# Patient Record
Sex: Female | Born: 1935 | Hispanic: No | Smoking: Former smoker
Health system: Southern US, Community
[De-identification: ages and names within clinical notes are randomized; demographics above are authoritative.]

## PROBLEM LIST (undated history)

## (undated) DIAGNOSIS — J449 Chronic obstructive pulmonary disease, unspecified: Secondary | ICD-10-CM

## (undated) DIAGNOSIS — F341 Dysthymic disorder: Secondary | ICD-10-CM

## (undated) DIAGNOSIS — N189 Chronic kidney disease, unspecified: Secondary | ICD-10-CM

## (undated) DIAGNOSIS — I714 Abdominal aortic aneurysm, without rupture, unspecified: Secondary | ICD-10-CM

## (undated) DIAGNOSIS — R7301 Impaired fasting glucose: Secondary | ICD-10-CM

## (undated) DIAGNOSIS — G479 Sleep disorder, unspecified: Secondary | ICD-10-CM

## (undated) DIAGNOSIS — E785 Hyperlipidemia, unspecified: Secondary | ICD-10-CM

## (undated) DIAGNOSIS — D649 Anemia, unspecified: Secondary | ICD-10-CM

## (undated) DIAGNOSIS — I10 Essential (primary) hypertension: Secondary | ICD-10-CM

## (undated) DIAGNOSIS — H3530 Unspecified macular degeneration: Secondary | ICD-10-CM

## (undated) HISTORY — DX: Essential (primary) hypertension: I10

## (undated) HISTORY — DX: Chronic obstructive pulmonary disease, unspecified: J44.9

## (undated) HISTORY — PX: ABDOMINAL AORTIC ANEURYSM REPAIR W/ ENDOLUMINAL GRAFT: SUR7

## (undated) HISTORY — DX: Impaired fasting glucose: R73.01

## (undated) HISTORY — DX: Unspecified macular degeneration: H35.30

## (undated) HISTORY — DX: Abdominal aortic aneurysm, without rupture, unspecified: I71.40

## (undated) HISTORY — DX: Chronic kidney disease, unspecified: N18.9

## (undated) HISTORY — DX: Dysthymic disorder: F34.1

## (undated) HISTORY — DX: Hyperlipidemia, unspecified: E78.5

## (undated) HISTORY — DX: Anemia, unspecified: D64.9

## (undated) HISTORY — DX: Abdominal aortic aneurysm, without rupture: I71.4

## (undated) HISTORY — DX: Sleep disorder, unspecified: G47.9

---

## 2004-06-10 ENCOUNTER — Emergency Department: Payer: Self-pay | Admitting: Emergency Medicine

## 2004-06-11 ENCOUNTER — Ambulatory Visit: Payer: Self-pay | Admitting: Emergency Medicine

## 2004-06-25 ENCOUNTER — Ambulatory Visit: Payer: Self-pay | Admitting: Surgery

## 2004-08-10 ENCOUNTER — Emergency Department: Payer: Self-pay | Admitting: Emergency Medicine

## 2005-01-22 ENCOUNTER — Ambulatory Visit: Payer: Self-pay | Admitting: Internal Medicine

## 2005-01-23 ENCOUNTER — Ambulatory Visit: Payer: Self-pay | Admitting: Surgery

## 2005-02-06 ENCOUNTER — Ambulatory Visit: Payer: Self-pay | Admitting: Internal Medicine

## 2005-03-14 ENCOUNTER — Ambulatory Visit: Payer: Self-pay | Admitting: Gastroenterology

## 2005-09-03 ENCOUNTER — Ambulatory Visit: Payer: Self-pay | Admitting: Ophthalmology

## 2005-09-09 ENCOUNTER — Ambulatory Visit: Payer: Self-pay | Admitting: Ophthalmology

## 2005-10-07 ENCOUNTER — Ambulatory Visit: Payer: Self-pay | Admitting: Ophthalmology

## 2005-12-06 ENCOUNTER — Ambulatory Visit: Payer: Self-pay | Admitting: Internal Medicine

## 2006-01-23 ENCOUNTER — Ambulatory Visit: Payer: Self-pay | Admitting: Internal Medicine

## 2006-12-09 ENCOUNTER — Ambulatory Visit: Payer: Self-pay | Admitting: Internal Medicine

## 2007-06-15 ENCOUNTER — Inpatient Hospital Stay: Payer: Self-pay | Admitting: Cardiovascular Disease

## 2007-06-16 ENCOUNTER — Other Ambulatory Visit: Payer: Self-pay

## 2007-10-16 ENCOUNTER — Encounter: Payer: Self-pay | Admitting: Internal Medicine

## 2007-11-09 ENCOUNTER — Encounter: Payer: Self-pay | Admitting: Internal Medicine

## 2007-12-08 ENCOUNTER — Ambulatory Visit: Payer: Self-pay | Admitting: Neurology

## 2007-12-09 ENCOUNTER — Encounter: Payer: Self-pay | Admitting: Internal Medicine

## 2008-01-09 ENCOUNTER — Encounter: Payer: Self-pay | Admitting: Internal Medicine

## 2008-07-27 ENCOUNTER — Ambulatory Visit: Payer: Self-pay | Admitting: Vascular Surgery

## 2008-10-23 ENCOUNTER — Inpatient Hospital Stay: Payer: Self-pay | Admitting: Internal Medicine

## 2008-10-31 ENCOUNTER — Observation Stay: Payer: Self-pay | Admitting: Internal Medicine

## 2008-11-11 ENCOUNTER — Emergency Department: Payer: Self-pay | Admitting: Emergency Medicine

## 2008-11-27 ENCOUNTER — Emergency Department: Payer: Self-pay | Admitting: Emergency Medicine

## 2009-01-01 ENCOUNTER — Emergency Department: Payer: Self-pay | Admitting: Emergency Medicine

## 2009-02-10 ENCOUNTER — Ambulatory Visit: Payer: Self-pay | Admitting: Internal Medicine

## 2009-02-10 ENCOUNTER — Emergency Department: Payer: Self-pay | Admitting: Emergency Medicine

## 2009-04-30 ENCOUNTER — Emergency Department: Payer: Self-pay

## 2010-06-12 ENCOUNTER — Inpatient Hospital Stay: Payer: Self-pay | Admitting: Internal Medicine

## 2011-03-21 ENCOUNTER — Emergency Department: Payer: Self-pay | Admitting: Emergency Medicine

## 2011-03-23 ENCOUNTER — Emergency Department: Payer: Self-pay | Admitting: Emergency Medicine

## 2011-03-29 ENCOUNTER — Emergency Department: Payer: Self-pay | Admitting: Emergency Medicine

## 2012-05-02 ENCOUNTER — Inpatient Hospital Stay: Payer: Self-pay | Admitting: Internal Medicine

## 2012-05-02 LAB — CBC WITH DIFFERENTIAL/PLATELET
Basophil #: 0.1 10*3/uL (ref 0.0–0.1)
Basophil %: 1.5 %
Eosinophil %: 1.2 %
HCT: 34.7 % — ABNORMAL LOW (ref 35.0–47.0)
Lymphocyte #: 2 10*3/uL (ref 1.0–3.6)
MCHC: 32.5 g/dL (ref 32.0–36.0)
MCV: 87 fL (ref 80–100)
Monocyte %: 8.6 %
Neutrophil #: 5.6 10*3/uL (ref 1.4–6.5)
RBC: 3.98 10*6/uL (ref 3.80–5.20)
RDW: 13.6 % (ref 11.5–14.5)
WBC: 8.6 10*3/uL (ref 3.6–11.0)

## 2012-05-02 LAB — BASIC METABOLIC PANEL
BUN: 22 mg/dL — ABNORMAL HIGH (ref 7–18)
Chloride: 105 mmol/L (ref 98–107)
EGFR (Non-African Amer.): 56 — ABNORMAL LOW
Glucose: 99 mg/dL (ref 65–99)
Osmolality: 283 (ref 275–301)
Potassium: 3.2 mmol/L — ABNORMAL LOW (ref 3.5–5.1)
Sodium: 140 mmol/L (ref 136–145)

## 2012-05-03 LAB — URINALYSIS, COMPLETE
Glucose,UR: NEGATIVE mg/dL (ref 0–75)
Hyaline Cast: 1
Nitrite: NEGATIVE
Ph: 5 (ref 4.5–8.0)
Protein: NEGATIVE
Specific Gravity: 1.014 (ref 1.003–1.030)
Squamous Epithelial: 2

## 2012-05-03 LAB — CBC WITH DIFFERENTIAL/PLATELET
Basophil #: 0 10*3/uL (ref 0.0–0.1)
Eosinophil %: 0 %
HGB: 11.8 g/dL — ABNORMAL LOW (ref 12.0–16.0)
Lymphocyte #: 0.8 10*3/uL — ABNORMAL LOW (ref 1.0–3.6)
Lymphocyte %: 10.3 %
MCH: 28.5 pg (ref 26.0–34.0)
MCHC: 33.1 g/dL (ref 32.0–36.0)
MCV: 86 fL (ref 80–100)
Monocyte #: 0 x10 3/mm — ABNORMAL LOW (ref 0.2–0.9)
Monocyte %: 0.5 %
Neutrophil #: 6.6 10*3/uL — ABNORMAL HIGH (ref 1.4–6.5)
Neutrophil %: 89.1 %

## 2012-05-03 LAB — BASIC METABOLIC PANEL
Anion Gap: 6 — ABNORMAL LOW (ref 7–16)
BUN: 24 mg/dL — ABNORMAL HIGH (ref 7–18)
Chloride: 106 mmol/L (ref 98–107)
Creatinine: 1.03 mg/dL (ref 0.60–1.30)
EGFR (African American): 57 — ABNORMAL LOW
Glucose: 172 mg/dL — ABNORMAL HIGH (ref 65–99)
Potassium: 3.7 mmol/L (ref 3.5–5.1)
Sodium: 141 mmol/L (ref 136–145)

## 2012-05-04 LAB — CBC WITH DIFFERENTIAL/PLATELET
Basophil #: 0 10*3/uL (ref 0.0–0.1)
Eosinophil #: 0 10*3/uL (ref 0.0–0.7)
HCT: 34 % — ABNORMAL LOW (ref 35.0–47.0)
HGB: 11 g/dL — ABNORMAL LOW (ref 12.0–16.0)
Lymphocyte #: 1 10*3/uL (ref 1.0–3.6)
MCH: 27.9 pg (ref 26.0–34.0)
MCHC: 32.2 g/dL (ref 32.0–36.0)
Neutrophil #: 18.4 10*3/uL — ABNORMAL HIGH (ref 1.4–6.5)
Platelet: 284 10*3/uL (ref 150–440)
RDW: 13.7 % (ref 11.5–14.5)

## 2012-05-04 LAB — BASIC METABOLIC PANEL
Anion Gap: 4 — ABNORMAL LOW (ref 7–16)
BUN: 24 mg/dL — ABNORMAL HIGH (ref 7–18)
Chloride: 108 mmol/L — ABNORMAL HIGH (ref 98–107)
Creatinine: 0.94 mg/dL (ref 0.60–1.30)
Osmolality: 289 (ref 275–301)
Sodium: 141 mmol/L (ref 136–145)

## 2013-08-03 ENCOUNTER — Encounter: Payer: Self-pay | Admitting: Internal Medicine

## 2013-08-03 DIAGNOSIS — I714 Abdominal aortic aneurysm, without rupture, unspecified: Secondary | ICD-10-CM | POA: Insufficient documentation

## 2013-08-03 DIAGNOSIS — I10 Essential (primary) hypertension: Secondary | ICD-10-CM | POA: Insufficient documentation

## 2013-08-03 DIAGNOSIS — N289 Disorder of kidney and ureter, unspecified: Secondary | ICD-10-CM | POA: Insufficient documentation

## 2013-08-03 DIAGNOSIS — N189 Chronic kidney disease, unspecified: Secondary | ICD-10-CM | POA: Insufficient documentation

## 2013-08-03 DIAGNOSIS — D649 Anemia, unspecified: Secondary | ICD-10-CM | POA: Insufficient documentation

## 2013-08-03 DIAGNOSIS — J449 Chronic obstructive pulmonary disease, unspecified: Secondary | ICD-10-CM | POA: Insufficient documentation

## 2013-08-03 DIAGNOSIS — E785 Hyperlipidemia, unspecified: Secondary | ICD-10-CM | POA: Insufficient documentation

## 2013-08-03 DIAGNOSIS — R7301 Impaired fasting glucose: Secondary | ICD-10-CM | POA: Insufficient documentation

## 2013-08-11 ENCOUNTER — Encounter: Payer: Self-pay | Admitting: Internal Medicine

## 2013-08-11 ENCOUNTER — Ambulatory Visit: Payer: Medicare Other | Admitting: Internal Medicine

## 2013-08-11 ENCOUNTER — Telehealth: Payer: Self-pay | Admitting: Internal Medicine

## 2013-08-11 VITALS — BP 124/72 | HR 79 | Resp 26

## 2013-08-11 DIAGNOSIS — E785 Hyperlipidemia, unspecified: Secondary | ICD-10-CM

## 2013-08-11 DIAGNOSIS — F39 Unspecified mood [affective] disorder: Secondary | ICD-10-CM | POA: Insufficient documentation

## 2013-08-11 DIAGNOSIS — J449 Chronic obstructive pulmonary disease, unspecified: Secondary | ICD-10-CM

## 2013-08-11 DIAGNOSIS — I10 Essential (primary) hypertension: Secondary | ICD-10-CM

## 2013-08-11 DIAGNOSIS — G479 Sleep disorder, unspecified: Secondary | ICD-10-CM

## 2013-08-11 DIAGNOSIS — I714 Abdominal aortic aneurysm, without rupture, unspecified: Secondary | ICD-10-CM

## 2013-08-11 DIAGNOSIS — F341 Dysthymic disorder: Secondary | ICD-10-CM

## 2013-08-11 MED ORDER — AMLODIPINE BESYLATE 5 MG PO TABS: 5.0000 mg | ORAL_TABLET | Freq: Every day | ORAL | Status: DC

## 2013-08-11 NOTE — Assessment & Plan Note (Signed)
On mirtazapine for this and mood May help appetite ---weight seems stable

## 2013-08-11 NOTE — Assessment & Plan Note (Signed)
Severe and on hospice care Non ambulatory 24 hour care incontinent

## 2013-08-11 NOTE — Assessment & Plan Note (Signed)
Given past surgery, will continue the beta blocker for now

## 2013-08-11 NOTE — Telephone Encounter (Signed)
Relevant patient education mailed to patient.  

## 2013-08-11 NOTE — Assessment & Plan Note (Signed)
Intermittent May be more reactive On the mirtazapine

## 2013-08-11 NOTE — Progress Notes (Signed)
Subjective:    Patient ID: Rita Cox, female    DOB: 08-Dec-1924, 80 y.o.   MRN: [id number]  HPI Son and daughter are here Harvie Heck(Randy and Vance GatherShelley) Priscilla--one of sitters is here Arline AspCindy the hospice nurse Had been under the care of Dr Dan HumphreysWalker  Has COPD for many year Doesn't stand---has hoyer lift Has 24 hour care Needs help dressing Hospice aides to bathe her twice a week Incontinent of bowel and bladder Continuous oxygen Generally comfortable---does get some dyspnea even with talking though Only occasional cough Sleeps in bed  Longstanding high blood pressure and cholesterol No problems with the care for this No MI or CAD Did have surgery for AAA-- including stenting of renal arteries  Has been followed for renal insufficiency No blood work while on hospice  Current Outpatient Prescriptions on File Prior to Visit  Medication Sig Dispense Refill  . albuterol (PROVENTIL HFA;VENTOLIN HFA) 108 (90 BASE) MCG/ACT inhaler Inhale 2 puffs into the lungs every 6 (six) hours as needed for wheezing or shortness of breath.      Marland Kitchen. amLODipine (NORVASC) 5 MG tablet Take 5 mg by mouth daily.      Marland Kitchen. aspirin EC 81 MG tablet Take 81 mg by mouth daily.      . Fluticasone-Salmeterol (ADVAIR) 250-50 MCG/DOSE AEPB Inhale 1 puff into the lungs 2 (two) times daily.      . hydrochlorothiazide (MICROZIDE) 12.5 MG capsule Take 12.5 mg by mouth daily.      Marland Kitchen. HYDROcodone-acetaminophen (NORCO/VICODIN) 5-325 MG per tablet Take 1 tablet by mouth 3 (three) times daily as needed for moderate pain.      . Infant Care Products (DERMACLOUD) CREA Apply topically.      Marland Kitchen. ipratropium-albuterol (DUONEB) 0.5-2.5 (3) MG/3ML SOLN Take 3 mLs by nebulization every 4 (four) hours as needed.      . metoprolol succinate (TOPROL-XL) 25 MG 24 hr tablet Take 12.5 mg by mouth 2 (two) times daily.      . mirtazapine (REMERON) 15 MG tablet Take 7.5 mg by mouth at bedtime.      Marland Kitchen. omeprazole (PRILOSEC) 20 MG capsule Take 20 mg by mouth  daily as needed.       . sennosides-docusate sodium (SENOKOT-S) 8.6-50 MG tablet Take 1 tablet by mouth daily.       Marland Kitchen. tiotropium (SPIRIVA) 18 MCG inhalation capsule Place 18 mcg into inhaler and inhale daily.      . traZODone (DESYREL) 100 MG tablet Take 100 mg by mouth at bedtime as needed.       . simvastatin (ZOCOR) 40 MG tablet Take 40 mg by mouth daily.       No current facility-administered medications on file prior to visit.    Not on File  Past Medical History  Diagnosis Date  . COPD (chronic obstructive pulmonary disease)   . Hypertension   . Hyperlipidemia   . Impaired fasting glucose   . AAA (abdominal aortic aneurysm)   . Chronic renal insufficiency   . Anemia   . Sleep disturbance   . Dysthymia   . Macular degeneration     Past Surgical History  Procedure Laterality Date  . Abdominal aortic aneurysm repair w/ endoluminal graft      Family History  Problem Relation Age of Onset  . Diabetes Mother   . Cancer Father   . COPD Father   . Heart disease Brother   . Cancer Brother     unknown    History  Social History  . Marital Status: Widowed    Spouse Name: N/A    Number of Children: 3  . 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 Smoking status: Former Smoker    Quit date: 06/10/1998  . Smokeless tobacco: Never Used  . Alcohol Use: No  . Drug Use: Not on file  . Sexual Activity: Not on file   Other Topics Concern  . Not on file   Social History Narrative   Has living will   Daughter Vance Gather should be health care POA   Discussed DNR--- she is clear about this.   No tube feeds if cognitively unaware   Review of Systems  Constitutional: Positive for fatigue. Negative for unexpected weight change.  HENT: Positive for mouth sores.        Top dentures Own teeth on bottom  Eyes: Positive for visual disturbance.       Poor vision  Respiratory: Positive for cough, chest tightness and  shortness of breath.   Cardiovascular: Positive for chest pain. Negative for palpitations.  Gastrointestinal: Positive for constipation. Negative for nausea, vomiting and blood in stool.  Genitourinary: Negative for dysuria and hematuria.  Musculoskeletal: Positive for arthralgias. Negative for back pain.       Arthritis in fingers Some knee and leg pain  Skin: Negative for rash.       Gets periodic redness on buttocks---stage 1 only  Neurological: Positive for dizziness. Negative for headaches.  Psychiatric/Behavioral: Positive for sleep disturbance and dysphoric mood. The patient is nervous/anxious.        Objective:   Physical Exam  Constitutional: She appears well-developed. No distress.  Neck: Normal range of motion. Neck supple. No thyromegaly present.  Cardiovascular: Normal rate, regular rhythm and normal heart sounds.  Exam reveals no gallop.   No murmur heard. Faint pedal pulses  Pulmonary/Chest: Effort normal. No respiratory distress. She has no wheezes.  Decreased breath sounds Slight basilar crackles  Abdominal: Soft. There is no tenderness.  Musculoskeletal: She exhibits no edema.  3+ LE strength 4/5 in UE  Lymphadenopathy:    She has no cervical adenopathy.  Skin: No rash noted. No erythema.  Psychiatric: She has a normal mood and affect. Her behavior is normal.          Assessment & Plan:

## 2013-08-11 NOTE — Assessment & Plan Note (Signed)
Given her status and goals of treatment--will stop the statin

## 2013-08-11 NOTE — Assessment & Plan Note (Signed)
BP Readings from Last 3 Encounters:  08/11/13 124/72   Good control Will stop the HCTZ

## 2013-08-12 ENCOUNTER — Telehealth: Payer: Self-pay | Admitting: Internal Medicine

## 2013-08-12 NOTE — Telephone Encounter (Signed)
Relevant patient education mailed to patient.  

## 2013-08-18 ENCOUNTER — Other Ambulatory Visit: Payer: Self-pay

## 2013-08-18 MED ORDER — MIRTAZAPINE 15 MG PO TABS: 7.5000 mg | ORAL_TABLET | Freq: Every day | ORAL | Status: DC

## 2013-08-18 NOTE — Telephone Encounter (Signed)
Cindy with [facility] left v/m requesting [facility]. [facility] Bascom [facility] and pt already has available refills on Amlodipine but does need refills on mirtazapine.Please advise.

## 2013-08-18 NOTE — Telephone Encounter (Signed)
Okay to refill for a year 

## 2013-08-27 ENCOUNTER — Other Ambulatory Visit: Payer: Self-pay

## 2013-08-27 MED ORDER — FLUTICASONE-SALMETEROL 250-50 MCG/DOSE IN AEPB: 1.0000 | INHALATION_SPRAY | Freq: Two times a day (BID) | RESPIRATORY_TRACT | Status: DC

## 2013-08-27 MED ORDER — TRAZODONE HCL 100 MG PO TABS: 100.0000 mg | ORAL_TABLET | Freq: Every evening | ORAL | Status: DC | PRN

## 2013-08-27 NOTE — Telephone Encounter (Signed)
[facility] said pt needs refill on advair (refill done) and Trazodone. Family is waiting on Trazodone refill.Please advise.

## 2013-08-27 NOTE — Telephone Encounter (Signed)
rx sent to pharmacy by e-script  

## 2013-09-17 DIAGNOSIS — J449 Chronic obstructive pulmonary disease, unspecified: Secondary | ICD-10-CM

## 2013-09-17 DIAGNOSIS — J4489 Other specified chronic obstructive pulmonary disease: Secondary | ICD-10-CM

## 2013-10-25 ENCOUNTER — Other Ambulatory Visit: Payer: Self-pay | Admitting: Internal Medicine

## 2013-10-25 NOTE — Telephone Encounter (Signed)
Ok to fill 

## 2013-10-26 NOTE — Telephone Encounter (Signed)
rx sent to pharmacy by e-script  

## 2013-10-26 NOTE — Telephone Encounter (Signed)
Okay to fill for a year I am going out there again tomorrow

## 2013-10-27 ENCOUNTER — Encounter: Payer: Self-pay | Admitting: Internal Medicine

## 2013-10-27 ENCOUNTER — Ambulatory Visit: Payer: Medicare Other | Admitting: Internal Medicine

## 2013-10-27 VITALS — BP 142/62 | HR 72 | Resp 22

## 2013-10-27 DIAGNOSIS — G479 Sleep disorder, unspecified: Secondary | ICD-10-CM

## 2013-10-27 DIAGNOSIS — I10 Essential (primary) hypertension: Secondary | ICD-10-CM

## 2013-10-27 DIAGNOSIS — F39 Unspecified mood [affective] disorder: Secondary | ICD-10-CM

## 2013-10-27 DIAGNOSIS — J449 Chronic obstructive pulmonary disease, unspecified: Secondary | ICD-10-CM

## 2013-10-27 DIAGNOSIS — N189 Chronic kidney disease, unspecified: Secondary | ICD-10-CM

## 2013-10-27 NOTE — Assessment & Plan Note (Signed)
Doing okay with current meds

## 2013-10-27 NOTE — Assessment & Plan Note (Signed)
BP Readings from Last 3 Encounters:  10/27/13 142/62  08/11/13 124/72   Good control No changes needed Will ask hospice to draw met C, CBC with diff, free T4, TSH before discharge

## 2013-10-27 NOTE — Assessment & Plan Note (Signed)
Will recheck labs 

## 2013-10-27 NOTE — Assessment & Plan Note (Signed)
Mostly related to disability Seems to be stable and satisfied Will continue current meds

## 2013-10-27 NOTE — Assessment & Plan Note (Addendum)
Severe but now stable sats 88% on room air and RR went up to 30 off oxygen---needs to continue Moderate care needs---they have full time help No changes  I will treat empirically for signs of infection since hospice is discharging and I can only get out infrequently

## 2013-10-27 NOTE — Progress Notes (Signed)
Subjective:    Patient ID: Rita Cox, female    DOB: 09/23/1924, 80 y.o.   MRN: [id number]  HPI Home visit Son and caregiver Gershon Cullriscilla are here Discussed with Arline Aspindy, hospice RN  Has been stable  Going off hospice She has severe COPD and has trouble breathing at night if the bed is elevated less than 30 degrees. Bed wedges do not provide enough elevation or stability to resolve the breathing problems. She has intermittent decubitus ulcers (usually stage 1) that require frequent changes in body position that can't be achieved with a normal bed. She continues to be unable to transfer or stand. She requires a 2 person lift for transfers--and this is not feasible for her caregivers. They have used the [employer] lift with success  Breathing is stable No coughing of note Occasional chest pain--brief Mild left foot edema at times---may be related to past surgery  Mood is okay Sleeps okay with current medications No major anxiety issues  No recent pain problems Hasn't needed the pain med  Current Outpatient Prescriptions on File Prior to Visit  Medication Sig Dispense Refill  . acetaminophen (TYLENOL) 500 MG tablet Take 500 mg by mouth every 6 (six) hours as needed.      Marland Kitchen. albuterol (PROVENTIL HFA;VENTOLIN HFA) 108 (90 BASE) MCG/ACT inhaler Inhale 2 puffs into the lungs every 6 (six) hours as needed for wheezing or shortness of breath.      Marland Kitchen. amLODipine (NORVASC) 5 MG tablet Take 1 tablet (5 mg total) by mouth daily.  90 tablet  3  . aspirin EC 81 MG tablet Take 81 mg by mouth daily.      . Fluticasone-Salmeterol (ADVAIR) 250-50 MCG/DOSE AEPB Inhale 1 puff into the lungs 2 (two) times daily.  60 each  2  . HYDROcodone-acetaminophen (NORCO/VICODIN) 5-325 MG per tablet Take 1 tablet by mouth 3 (three) times daily as needed for moderate pain.      . Infant Care Products (DERMACLOUD) CREA Apply topically.      Marland Kitchen. ipratropium-albuterol (DUONEB) 0.5-2.5 (3) MG/3ML SOLN Take 3 mLs by nebulization  every 4 (four) hours as needed.      . metoprolol succinate (TOPROL-XL) 25 MG 24 hr tablet Take 12.5 mg by mouth 2 (two) times daily.      . mirtazapine (REMERON) 15 MG tablet Take 0.5 tablets (7.5 mg total) by mouth at bedtime.  30 tablet  11  . Multiple Vitamins-Minerals (MULTIVITAMIN WITH MINERALS) tablet Take 1 tablet by mouth daily.      Marland Kitchen. omeprazole (PRILOSEC) 20 MG capsule Take 20 mg by mouth daily as needed.       . sennosides-docusate sodium (SENOKOT-S) 8.6-50 MG tablet Take 1 tablet by mouth daily.       Marland Kitchen. tiotropium (SPIRIVA) 18 MCG inhalation capsule Place 18 mcg into inhaler and inhale daily.      . traZODone (DESYREL) 100 MG tablet TAKE ONE TABLET BY MOUTH AT BEDTIME AS NEEDED  30 tablet  11   No current facility-administered medications on file prior to visit.    Not on File  Past Medical History  Diagnosis Date  . COPD (chronic obstructive pulmonary disease)   . Hypertension   . Hyperlipidemia   . Impaired fasting glucose   . AAA (abdominal aortic aneurysm)   . Chronic renal insufficiency   . Anemia   . Sleep disturbance   . Dysthymia   . Macular degeneration     Past Surgical History  Procedure Laterality Date  .  Abdominal aortic aneurysm repair w/ endoluminal graft      Family History  Problem Relation Age of Onset  . Diabetes Mother   . Cancer Father   . COPD Father   . Heart disease Brother   . Cancer Brother     unknown    History   Social History  . Marital Status: Widowed    Spouse Name: N/A    Number of Children: 3  . 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 Smoking status: Former Smoker    Quit date: 06/10/1998  . Smokeless tobacco: Never Used  . Alcohol Use: No  . Drug Use: Not on file  . Sexual Activity: Not on file   Other Topics Concern  . Not on file   Social History Narrative   Has living will   Daughter Vance GatherShelley should be health care POA   Discussed DNR--- she is  clear about this.   No tube feeds if cognitively unaware   Review of Systems Appetite is fine Weight seems to be stable    Objective:   Physical Exam  Constitutional: She appears well-developed. No distress.  Neck: Normal range of motion. Neck supple. No thyromegaly present.  Cardiovascular: Normal rate, regular rhythm and normal heart sounds.  Exam reveals no gallop.   No murmur heard. Pulmonary/Chest: Effort normal. No respiratory distress. She has no wheezes. She has no rales.  Decreased breath sounds but clear  Abdominal: Soft. There is no tenderness.  Musculoskeletal: She exhibits no edema and no tenderness.  Lymphadenopathy:    She has no cervical adenopathy.  Psychiatric: She has a normal mood and affect. Her behavior is normal.          Assessment & Plan:

## 2013-11-08 ENCOUNTER — Telehealth: Payer: Self-pay

## 2013-11-08 NOTE — Telephone Encounter (Signed)
Cindy with [facility] said pt is to be discharged from [facility] on 11/09/13 due to pt's stability. Pt presently has hoyer lift, oxygen concentrator and hospital bed in the home supplied by [facility]. Arline Asp has checked with [facility] and neither has orders from Dr Alphonsus Sias to continue this medical equipment in the home upon discharge from hospice on 11/09/13. Arline Asp said would be simpler to fax orders to [facility] with orders at fax # [phone]. Cindy request cb when done.

## 2013-11-09 NOTE — Telephone Encounter (Signed)
Spoke with cindy and advised results

## 2013-11-09 NOTE — Telephone Encounter (Signed)
Please fax the order and a copy of my last note to [facility] Let Arline Asp know when done

## 2014-01-05 ENCOUNTER — Encounter: Payer: Self-pay | Admitting: Internal Medicine

## 2014-01-05 ENCOUNTER — Ambulatory Visit: Payer: Medicare Other | Admitting: Internal Medicine

## 2014-01-05 VITALS — BP 122/76 | HR 90 | Resp 20

## 2014-01-05 DIAGNOSIS — J438 Other emphysema: Secondary | ICD-10-CM

## 2014-01-05 DIAGNOSIS — F39 Unspecified mood [affective] disorder: Secondary | ICD-10-CM

## 2014-01-05 DIAGNOSIS — I10 Essential (primary) hypertension: Secondary | ICD-10-CM

## 2014-01-05 DIAGNOSIS — N183 Chronic kidney disease, stage 3 unspecified: Secondary | ICD-10-CM

## 2014-01-05 DIAGNOSIS — I714 Abdominal aortic aneurysm, without rupture, unspecified: Secondary | ICD-10-CM

## 2014-01-05 DIAGNOSIS — G479 Sleep disorder, unspecified: Secondary | ICD-10-CM

## 2014-01-05 DIAGNOSIS — J431 Panlobular emphysema: Secondary | ICD-10-CM

## 2014-01-05 MED ORDER — NYSTATIN 100000 UNIT/GM EX POWD: Freq: Two times a day (BID) | CUTANEOUS | Status: AC | PRN

## 2014-01-05 NOTE — Assessment & Plan Note (Signed)
Will try increasing the mirtazapine

## 2014-01-05 NOTE — Assessment & Plan Note (Addendum)
Severe but stable Lift for transfers Assist with all ADLs Stable though!  Running out of money Children may need to get her into skilled care at the start of 2016--they have [facility] into [facility]

## 2014-01-05 NOTE — Progress Notes (Signed)
Subjective:    Patient ID: Rita IdaRuth Cox, female    DOB: [birth year], 80 y.o.   MRN: [id number]  HPI Rita Cullriscilla aide as well as son and daughter are here  Ongoing sleep problems Is "wide awake" soon after going to bed Doesn't usually nap in day Will try increasing the mirtazapine Not really tired in day  Breathing is okay Regular with advair and tiotropium but doesn't need duoneb or inhaler often Continues on her oxygen all the time Some cough but not consistent No fever  Arthritis hasn't been bad No hydrocodone lately  Remains satisfied Enjoys TV Spends day in recliner-- bed at night Aide will take her out if the weather is okay (on wheelchair)  No chest pain No palpitations No dizziness now--some orthostatic dizziness a while back. No syncope  Current Outpatient Prescriptions on File Prior to Visit  Medication Sig Dispense Refill  . acetaminophen (TYLENOL) 500 MG tablet Take 500 mg by mouth every 6 (six) hours as needed.      Marland Kitchen. albuterol (PROVENTIL HFA;VENTOLIN HFA) 108 (90 BASE) MCG/ACT inhaler Inhale 2 puffs into the lungs every 6 (six) hours as needed for wheezing or shortness of breath.      Marland Kitchen. amLODipine (NORVASC) 5 MG tablet Take 1 tablet (5 mg total) by mouth daily.  90 tablet  3  . aspirin EC 81 MG tablet Take 81 mg by mouth daily.      . Fluticasone-Salmeterol (ADVAIR) 250-50 MCG/DOSE AEPB Inhale 1 puff into the lungs 2 (two) times daily.  60 each  2  . HYDROcodone-acetaminophen (NORCO/VICODIN) 5-325 MG per tablet Take 1 tablet by mouth 3 (three) times daily as needed for moderate pain.      . Infant Care Products (DERMACLOUD) CREA Apply topically.      Marland Kitchen. ipratropium-albuterol (DUONEB) 0.5-2.5 (3) MG/3ML SOLN Take 3 mLs by nebulization every 4 (four) hours as needed.      . metoprolol succinate (TOPROL-XL) 25 MG 24 hr tablet Take 12.5 mg by mouth 2 (two) times daily.      . Multiple Vitamins-Minerals (MULTIVITAMIN WITH MINERALS) tablet Take 1 tablet by mouth daily.       Marland Kitchen. omeprazole (PRILOSEC) 20 MG capsule Take 20 mg by mouth daily as needed.       . sennosides-docusate sodium (SENOKOT-S) 8.6-50 MG tablet Take 1 tablet by mouth every other day.       . tiotropium (SPIRIVA) 18 MCG inhalation capsule Place 18 mcg into inhaler and inhale daily.      . traZODone (DESYREL) 100 MG tablet TAKE ONE TABLET BY MOUTH AT BEDTIME AS NEEDED  30 tablet  11   No current facility-administered medications on file prior to visit.    Not on File  Past Medical History  Diagnosis Date  . COPD (chronic obstructive pulmonary disease)   . Hypertension   . Hyperlipidemia   . Impaired fasting glucose   . AAA (abdominal aortic aneurysm)   . Chronic renal insufficiency   . Anemia   . Sleep disturbance   . Dysthymia   . Macular degeneration     Past Surgical History  Procedure Laterality Date  . Abdominal aortic aneurysm repair w/ endoluminal graft      Family History  Problem Relation Age of Onset  . Diabetes Mother   . Cancer Father   . COPD Father   . Heart disease Brother   . Cancer Brother     unknown    History   Social History  .  Marital Status: Widowed    Spouse Name: N/A    Number of Children: 3  . 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 Smoking status: Former Smoker    Quit date: 06/10/1998  . Smokeless tobacco: Never Used  . Alcohol Use: No  . Drug Use: Not on file  . Sexual Activity: Not on file   Other Topics Concern  . Not on file   Social History Narrative   Has living will   Daughter Vance Gather should be health care POA   Discussed DNR--- she is clear about this.   No tube feeds if cognitively unaware   Review of Systems Bowels okay --does take the senna. Will rarely get castor oil or suppository also. Incontinent---just uses diapers Needs lift to be transferred Flaking at ears Spot under breast--just stays there Feet stay swollen--even with elevation. No pain      Objective:   Physical Exam  Constitutional: She appears well-developed. No distress.  Neck: Normal range of motion. Neck supple. No thyromegaly present.  Cardiovascular: Normal rate, regular rhythm and normal heart sounds.  Exam reveals no gallop.   No murmur heard. Faint pedal pulses  Pulmonary/Chest: Effort normal. No respiratory distress. She has no wheezes. She has no rales.  Decreased breath sounds throughout with rhonchi  Abdominal: Soft. There is no tenderness.  Musculoskeletal: She exhibits no edema and no tenderness.  Lymphadenopathy:    She has no cervical adenopathy.  Skin:  Mild fungal intertrigo under left breast--will Rx nystatin powder  Psychiatric: She has a normal mood and affect. Her behavior is normal.          Assessment & Plan:

## 2014-01-05 NOTE — Assessment & Plan Note (Signed)
Mood has been good, despite disability Ongoing sleep problems---will increase the mirtazapine to 15 mg nightly Continue the trazodone

## 2014-01-05 NOTE — Assessment & Plan Note (Signed)
No symptoms Labs not done before hospice left---will have to do without

## 2014-01-05 NOTE — Assessment & Plan Note (Signed)
BP Readings from Last 3 Encounters:  01/05/14 122/76  10/27/13 142/62  08/11/13 124/72   Good control No changes needed

## 2014-01-05 NOTE — Assessment & Plan Note (Signed)
No evaluation planned

## 2014-03-09 ENCOUNTER — Other Ambulatory Visit: Payer: Self-pay | Admitting: *Deleted

## 2014-03-09 MED ORDER — TIOTROPIUM BROMIDE MONOHYDRATE 18 MCG IN CAPS: 18.0000 ug | ORAL_CAPSULE | Freq: Every day | RESPIRATORY_TRACT | Status: AC

## 2014-03-16 ENCOUNTER — Encounter: Payer: Self-pay | Admitting: Internal Medicine

## 2014-03-16 ENCOUNTER — Ambulatory Visit: Payer: Medicare Other | Admitting: Internal Medicine

## 2014-03-16 VITALS — BP 140/74 | HR 80 | Resp 24

## 2014-03-16 DIAGNOSIS — N183 Chronic kidney disease, stage 3 unspecified: Secondary | ICD-10-CM

## 2014-03-16 DIAGNOSIS — M159 Polyosteoarthritis, unspecified: Secondary | ICD-10-CM | POA: Insufficient documentation

## 2014-03-16 DIAGNOSIS — I10 Essential (primary) hypertension: Secondary | ICD-10-CM

## 2014-03-16 DIAGNOSIS — M150 Primary generalized (osteo)arthritis: Secondary | ICD-10-CM

## 2014-03-16 DIAGNOSIS — G479 Sleep disorder, unspecified: Secondary | ICD-10-CM

## 2014-03-16 DIAGNOSIS — F39 Unspecified mood [affective] disorder: Secondary | ICD-10-CM

## 2014-03-16 DIAGNOSIS — J449 Chronic obstructive pulmonary disease, unspecified: Secondary | ICD-10-CM

## 2014-03-16 DIAGNOSIS — Z23 Encounter for immunization: Secondary | ICD-10-CM

## 2014-03-16 MED ORDER — DERMACLOUD EX CREA: 1.0000 "application " | TOPICAL_CREAM | Freq: Two times a day (BID) | CUTANEOUS | Status: AC | PRN

## 2014-03-16 NOTE — Assessment & Plan Note (Signed)
Has been better Rarely needs the hydrocodone now

## 2014-03-16 NOTE — Assessment & Plan Note (Signed)
BP Readings from Last 3 Encounters:  03/16/14 140/74  01/05/14 122/76  10/27/13 142/62   Good control on metoprolol It hasn't seem to affect her breathing--no change needed

## 2014-03-16 NOTE — Assessment & Plan Note (Signed)
Still with limited status No longer even bears weight Doing well with full time caregiver--son and daughter watch on weekends Hasn't needed rescue inhaler much Flu and prevnar given

## 2014-03-16 NOTE — Assessment & Plan Note (Signed)
Seems to be better with low dose mirtazapine along with the trazodone No change

## 2014-03-16 NOTE — Assessment & Plan Note (Signed)
Mood has been fine on mirtazapine

## 2014-03-16 NOTE — Assessment & Plan Note (Signed)
No uremic symptoms No access to phlebotomy now so will not recheck for now

## 2014-03-16 NOTE — Progress Notes (Signed)
Subjective:    Patient ID: Rita IdaRuth Kivi, female    DOB: December 04, 1924, 80 y.o.   MRN: [id number]  HPI Daughter is here  Gershon Cullriscilla the aide is also here  Having trouble with constipation Only giving 1 senna S daily Just tried a small amount of polyethylene glycol Occasional abdominal pain Appetite is okay--but not great  Breathing is about the same Doesn't walk--can't stand to transfer (using the hoyer lift) Will move between bed and recliner with wheelchair No regular cough No significant dyspnea--but can't do any activity without feeling SOB Doesn't use commode--just the diapers  Arthritis pain has been okay Does have pain in hands when getting cleaned in bath Hasn't needed the hydrocodone regularly--just once in a while  Still with sleep problems Uses the trazodone and 1/2 of mirtazapine--somewhat better in general Rare nights where she is up all night Not much sleeping in the day  No chest pain No palpitations  No dizziness Slight edema at times Rare headaches  Current Outpatient Prescriptions on File Prior to Visit  Medication Sig Dispense Refill  . acetaminophen (TYLENOL) 500 MG tablet Take 500 mg by mouth every 6 (six) hours as needed.      Marland Kitchen. albuterol (PROVENTIL HFA;VENTOLIN HFA) 108 (90 BASE) MCG/ACT inhaler Inhale 2 puffs into the lungs every 6 (six) hours as needed for wheezing or shortness of breath.      Marland Kitchen. amLODipine (NORVASC) 5 MG tablet Take 1 tablet (5 mg total) by mouth daily.  90 tablet  3  . aspirin EC 81 MG tablet Take 81 mg by mouth daily.      . Fluticasone-Salmeterol (ADVAIR) 250-50 MCG/DOSE AEPB Inhale 1 puff into the lungs 2 (two) times daily.  60 each  2  . HYDROcodone-acetaminophen (NORCO/VICODIN) 5-325 MG per tablet Take 1 tablet by mouth 3 (three) times daily as needed for moderate pain.      . Infant Care Products (DERMACLOUD) CREA Apply topically.      Marland Kitchen. ipratropium-albuterol (DUONEB) 0.5-2.5 (3) MG/3ML SOLN Take 3 mLs by nebulization every 4  (four) hours as needed.      . metoprolol succinate (TOPROL-XL) 25 MG 24 hr tablet Take 12.5 mg by mouth 2 (two) times daily.      . mirtazapine (REMERON) 15 MG tablet Take 7.5 mg by mouth at bedtime.       . Multiple Vitamins-Minerals (MULTIVITAMIN WITH MINERALS) tablet Take 1 tablet by mouth daily.      Marland Kitchen. nystatin (MYCOSTATIN) powder Apply topically 2 (two) times daily as needed. For rash under breasts  60 g  11  . omeprazole (PRILOSEC) 20 MG capsule Take 20 mg by mouth daily as needed.       . sennosides-docusate sodium (SENOKOT-S) 8.6-50 MG tablet Take 2 tablets by mouth daily.       Marland Kitchen. tiotropium (SPIRIVA) 18 MCG inhalation capsule Place 1 capsule (18 mcg total) into inhaler and inhale daily.  30 capsule  5  . traZODone (DESYREL) 100 MG tablet TAKE ONE TABLET BY MOUTH AT BEDTIME AS NEEDED  30 tablet  11   No current facility-administered medications on file prior to visit.    Not on File  Past Medical History  Diagnosis Date  . COPD (chronic obstructive pulmonary disease)   . Hypertension   . Hyperlipidemia   . Impaired fasting glucose   . AAA (abdominal aortic aneurysm)   . Chronic renal insufficiency   . Anemia   . Sleep disturbance   . Dysthymia   .  Macular degeneration     Past Surgical History  Procedure Laterality Date  . Abdominal aortic aneurysm repair w/ endoluminal graft      Family History  Problem Relation Age of Onset  . Diabetes Mother   . Cancer Father   . COPD Father   . Heart disease Brother   . Cancer Brother     unknown    History   Social History  . Marital Status: Widowed    Spouse Name: N/A    Number of Children: 3  . 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 Smoking status: Former Smoker    Quit date: 06/10/1998  . Smokeless tobacco: Never Used  . Alcohol Use: No  . Drug Use: Not on file  . Sexual Activity: Not on file   Other Topics Concern  . Not on file   Social  History Narrative   Has living will   Daughter Vance Gather should be health care POA   Discussed DNR--- she is clear about this.   No tube feeds if cognitively unaware   Review of Systems Weight seems to be stable Occasionally gets sense that she is falling when turning in bed--gets vertigo. Does settle down quickly Incontinent of bowel and bladder Gets some redness on bottom---dermacloud has controlled so no ulcers No heartburn or swallowing problems on omeprazole    Objective:   Physical Exam  Constitutional: She appears well-developed. No distress.  Neck: Normal range of motion. Neck supple. No thyromegaly present.  Cardiovascular: Normal rate, regular rhythm, normal heart sounds and intact distal pulses.  Exam reveals no gallop.   No murmur heard. Pulmonary/Chest: Effort normal. No respiratory distress. She has no wheezes.  Decreased breath sounds Scattered fine crackles throughout  Abdominal: Soft. There is no tenderness.  Musculoskeletal: She exhibits no edema and no tenderness.  Lymphadenopathy:    She has no cervical adenopathy.  Skin: No rash noted.  Psychiatric: She has a normal mood and affect. Her behavior is normal.          Assessment & Plan:

## 2014-04-11 ENCOUNTER — Other Ambulatory Visit: Payer: Self-pay | Admitting: Internal Medicine

## 2014-06-01 ENCOUNTER — Ambulatory Visit: Payer: Medicare Other | Admitting: Internal Medicine

## 2014-06-01 ENCOUNTER — Encounter: Payer: Self-pay | Admitting: Internal Medicine

## 2014-06-01 VITALS — BP 136/76 | HR 74 | Resp 16

## 2014-06-01 DIAGNOSIS — N183 Chronic kidney disease, stage 3 unspecified: Secondary | ICD-10-CM

## 2014-06-01 DIAGNOSIS — G479 Sleep disorder, unspecified: Secondary | ICD-10-CM

## 2014-06-01 DIAGNOSIS — J449 Chronic obstructive pulmonary disease, unspecified: Secondary | ICD-10-CM

## 2014-06-01 DIAGNOSIS — M150 Primary generalized (osteo)arthritis: Secondary | ICD-10-CM

## 2014-06-01 DIAGNOSIS — M159 Polyosteoarthritis, unspecified: Secondary | ICD-10-CM

## 2014-06-01 DIAGNOSIS — I10 Essential (primary) hypertension: Secondary | ICD-10-CM

## 2014-06-01 DIAGNOSIS — F39 Unspecified mood [affective] disorder: Secondary | ICD-10-CM

## 2014-06-01 NOTE — Progress Notes (Signed)
Subjective:    Patient ID: Rita Cox, female    DOB: [birth year], 80 y.o.   MRN: [id number]  HPI Home visit to review chronic medical problems  Caregiver Gershon Cullriscilla here and son  Overall stable Has been sleeping more Appetite is down some Up late---basically eats lunch, then supper and no time for more Does still love her chocolate and cookies Weight seems to be stable  Gets up with lift Moves in wheelchair Gets bed bath and Gershon Cullriscilla helps her dressing Incontinent--goes in pad  Breathing is okay No recent illness No regular cough No SOB--but doesn't do much Will use the inhaler at times----like after bath or transferring On oxygen all the time  Gets pain in her hands with bathing Right 4th finger remains locked Discussed using the acetaminophen before baths Hasn't needed the hydrocodone  Remains frustrated by inability to walk Enjoys TV Does have some down days but no persistent depression Upset about prospect of needing SNF --family is looking into [facility]  Sleeps well with the trazodone and mirtazapine  No recent troubles with reflux Hasn't needed the omeprazole lately  Current Outpatient Prescriptions on File Prior to Visit  Medication Sig Dispense Refill  . acetaminophen (TYLENOL) 500 MG tablet Take 500 mg by mouth every 6 (six) hours as needed.    Marland Kitchen. ADVAIR DISKUS 250-50 MCG/DOSE AEPB INHALE 1 PUFF TWICE A DAY 60 each 0  . albuterol (PROVENTIL HFA;VENTOLIN HFA) 108 (90 BASE) MCG/ACT inhaler Inhale 2 puffs into the lungs every 6 (six) hours as needed for wheezing or shortness of breath.    Marland Kitchen. amLODipine (NORVASC) 5 MG tablet Take 1 tablet (5 mg total) by mouth daily. 90 tablet 3  . aspirin EC 81 MG tablet Take 81 mg by mouth daily.    Marland Kitchen. HYDROcodone-acetaminophen (NORCO/VICODIN) 5-325 MG per tablet Take 1 tablet by mouth 3 (three) times daily as needed for moderate pain.    . Infant Care Products (DERMACLOUD) CREA Apply 1 application topically 2 (two)  times daily as needed. 430 g 5  . ipratropium-albuterol (DUONEB) 0.5-2.5 (3) MG/3ML SOLN Take 3 mLs by nebulization every 4 (four) hours as needed.    . metoprolol succinate (TOPROL-XL) 25 MG 24 hr tablet Take 12.5 mg by mouth 2 (two) times daily.    . mirtazapine (REMERON) 15 MG tablet Take 7.5 mg by mouth at bedtime.     . Multiple Vitamins-Minerals (MULTIVITAMIN WITH MINERALS) tablet Take 1 tablet by mouth daily.    Marland Kitchen. nystatin (MYCOSTATIN) powder Apply topically 2 (two) times daily as needed. For rash under breasts 60 g 11  . omeprazole (PRILOSEC) 20 MG capsule Take 20 mg by mouth daily as needed.     . polyethylene glycol (MIRALAX / GLYCOLAX) packet Take 17 g by mouth daily.    . polyethylene glycol powder (GLYCOLAX/MIRALAX) powder Take 1 Container by mouth once.    . sennosides-docusate sodium (SENOKOT-S) 8.6-50 MG tablet Take 2 tablets by mouth daily.     Marland Kitchen. tiotropium (SPIRIVA) 18 MCG inhalation capsule Place 1 capsule (18 mcg total) into inhaler and inhale daily. 30 capsule 5  . traZODone (DESYREL) 100 MG tablet TAKE ONE TABLET BY MOUTH AT BEDTIME AS NEEDED 30 tablet 11   No current facility-administered medications on file prior to visit.    Not on File  Past Medical History  Diagnosis Date  . COPD (chronic obstructive pulmonary disease)   . Hypertension   . Hyperlipidemia   . Impaired fasting glucose   .  AAA (abdominal aortic aneurysm)   . Chronic renal insufficiency   . Anemia   . Sleep disturbance   . Dysthymia   . Macular degeneration     Past Surgical History  Procedure Laterality Date  . Abdominal aortic aneurysm repair w/ endoluminal graft      Family History  Problem Relation Age of Onset  . Diabetes Mother   . Cancer Father   . COPD Father   . Heart disease Brother   . Cancer Brother     unknown    History   Social History  . Marital Status: Widowed    Spouse Name: N/A    Number of Children: 3  . Years of Education: N/A   Occupational History    . 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 Smoking status: Former Smoker    Quit date: 06/10/1998  . Smokeless tobacco: Never Used  . Alcohol Use: No  . Drug Use: Not on file  . Sexual Activity: Not on file   Other Topics Concern  . Not on file   Social History Narrative   Has living will   Daughter Vance GatherShelley should be health care POA   Discussed DNR--- she is clear about this.   No tube feeds if cognitively unaware   Review of Systems Sleeps fairly well. Has HOB elevated some or else she coughs Bowels are okay with the miralax--goes every other day or so usually No skin problems ---dermacloud helps if any early redness. No stage 2 thus far    Objective:   Physical Exam  Constitutional: She appears well-developed. No distress.  Neck: Normal range of motion. Neck supple. No thyromegaly present.  Cardiovascular: Normal rate, regular rhythm and normal heart sounds.  Exam reveals no gallop.   No murmur heard. Pulmonary/Chest: Effort normal. No respiratory distress. She has no wheezes. She has no rales.  Decreased breath sounds but clear  Abdominal: Soft. She exhibits no distension. There is no tenderness. There is no rebound and no guarding.  Musculoskeletal: She exhibits no edema or tenderness.  Lymphadenopathy:    She has no cervical adenopathy.  Psychiatric: She has a normal mood and affect. Her behavior is normal.          Assessment & Plan:

## 2014-06-01 NOTE — Assessment & Plan Note (Signed)
Unable to do personal care or walk anymore Getting ready to go to SNF Doing okay on current regimen

## 2014-06-01 NOTE — Assessment & Plan Note (Signed)
Still has some pain in hands May benefit from more regular acetaminophen---like before baths, etc Discussed with aide Hasn't needed norco lately--but will keep available

## 2014-06-01 NOTE — Assessment & Plan Note (Signed)
Does okay with trazodone and mirtazapine

## 2014-06-01 NOTE — Assessment & Plan Note (Signed)
BP Readings from Last 3 Encounters:  06/01/14 136/76  03/16/14 140/74  01/05/14 122/76   Controlled on current meds

## 2014-06-01 NOTE — Assessment & Plan Note (Signed)
No uremic symptoms Will need labs when she gets to SNF

## 2014-06-01 NOTE — Assessment & Plan Note (Signed)
Mostly dysthymia May have some adjustment issues moving to SNF

## 2014-06-13 ENCOUNTER — Telehealth: Payer: Self-pay | Admitting: Internal Medicine

## 2014-06-13 NOTE — Telephone Encounter (Signed)
Find out why this needs to be redone---I just did it. I had called because I forgot to put the advair on the other one---it just needed to be added I left a message just now on his cell

## 2014-06-13 NOTE — Telephone Encounter (Signed)
Pt's son, Harvie Heck, dropped off [employer] form to be filled out by Dr Alphonsus Sias. Forms placed on Dee's desk to then give to Dr Alphonsus Sias. Please call pt's son, Harvie Heck, at [phone] when forms are ready to be picked up. Thank you!

## 2014-06-15 DIAGNOSIS — Z7689 Persons encountering health services in other specified circumstances: Secondary | ICD-10-CM

## 2014-06-15 NOTE — Telephone Encounter (Signed)
Form is ready Copy for scanning and ready for son

## 2014-06-15 NOTE — Telephone Encounter (Signed)
Spoke with son and he lost it, he's aware of form fee

## 2014-06-15 NOTE — Telephone Encounter (Signed)
[facility] notified by [facility] is ready and of charge.

## 2014-06-20 ENCOUNTER — Other Ambulatory Visit: Payer: Self-pay | Admitting: Internal Medicine

## 2014-06-27 ENCOUNTER — Telehealth: Payer: Self-pay | Admitting: Internal Medicine

## 2014-06-27 NOTE — Telephone Encounter (Signed)
This was expected--we had discussed it

## 2014-06-27 NOTE — Telephone Encounter (Signed)
Rita Cox called to let you know Rita Cox is going into  [facility]  Rita Cox wanted to thank you for all your help

## 2014-09-27 NOTE — H&P (Signed)
PATIENT NAME:  Rita Cox, Rita Cox MR#:  [id number] DATE OF BIRTH:  1924-11-28  DATE OF ADMISSION:  05/02/2012  PRIMARY CARE PHYSICIAN: Dr. Yates Decamp at [facility] in [place].   CHIEF COMPLAINT: Shortness of breath.   HISTORY OF PRESENT ILLNESS: This is an 81 year old female with past medical history of chronic obstructive pulmonary disease, hypertension, hyperlipidemia, using home oxygen and inhalers at home and wheelchair and walker bound, needs help in day to day activities at home. She had a visitor, her brother, who was having flulike symptoms last weekend and since then she is having symptoms of shortness of breath and dry cough. She called her primary care physician about 5 to 6 days ago and he prescribed azithromycin on the phone and she finished the course of five days of azithromycin. She is feeling a little better, not fully relieved with that, so finally called EMS today and came to the Emergency Room. On further questioning, she says that her shortness of breath is gradually worsening, just a little better with course of antibiotic, but she still feels need of oxygen which she is using 24/7, still not feeling comfortable with oxygen. She has dry cough. No fever, no chest pain. Denies all other complaints.  REVIEW OF SYSTEMS: CONSTITUTIONAL: Denies fever, fatigue, weakness, pain, weight loss or weight gain. EYES: Denies blurring of vision, double vision, pain, or redness or inflammation of the eyes. ENT: Denies tinnitus, ear pain, or any discharge from the ears. RESPIRATORY: Has cough and shortness of breath, but denies any hemoptysis. CARDIOVASCULAR: Denies chest pain, orthopnea, edema. GASTROINTESTINAL: Denies nausea, vomiting, diarrhea, abdominal pain. GENITOURINARY: Denies dysuria, hematuria, or increased frequency. ENDOCRINE: Denies polyuria, nocturia, HEMATOLOGIC: Denies easy bruisability or anemia. MUSCULOSKELETAL: Denies pain or swelling of any joints or limbs. NEUROLOGICAL: Denies  numbness, weakness or epilepsy.  PAST MEDICAL HISTORY:  1. Hypertension. 2. Hyperlipidemia. 3. Chronic obstructive pulmonary disease. 4. Gastroesophageal reflux disease.   PAST SURGICAL HISTORY:  1. Cataract surgery  2. Abdominal aortic aneurysm surgery four years ago   ALLERGIES: No known drug allergies.   HOME MEDICATION:  1. Advair Diskus 250 mcg 50 mcg inhaled powder 2 times a day.  2. Amlodipine 5 mg once a day. 3. Aspirin enteric-coated 81 mg once a day. 4. Dulcolax 50 mg 8.6 mg oral tablet once a day as needed for bowel movement.  5. DuoNebs 0.5 mg 2.5 mg inhaled solution every four hours as needed.  6. Metoprolol 25 mg oral tablet 0.5 tablets 2 times a day.  7. Mirtazapine 15 mg oral tablet 0.5 tablets orally once a day.  8. Omeprazole 20 mg delayed-release 1 tablet 2 times a day.  9. ProAir HFA 90 mcg inhalation aerosol one puff once a day. 10. Simvastatin 40 mg once a day at bedtime. 11. Spiriva 18 mcg inhaled capsule once a day. 12. Sucralfate 1 gram oral tablet once a day. 13. Tylenol Extra Strength PM once a day at bedtime.   SOCIAL HISTORY: She was a smoker for almost 40 years, but quit 10 years ago. Denies drinking alcohol or any illegal drug use.   FAMILY HISTORY: Positive in father for cancer of the vertebra.   PHYSICAL EXAMINATION:  VITAL SIGNS: Temperature 98.4, blood pressure 144/61, heart rate 84, respirations 20, oxygen saturation 87 on room air on arrival, which is now 96 on 3 liters nasal cannula supplementation.   GENERAL: Currently alert, oriented and in no acute distress, fully cooperative with physical examination and history taking.   HEAD AND  NECK: Atraumatic. Muscle wasting on forehead is appropriate for age. Sclerae anicteric. Conjunctivae pink. Oral mucosa moist. Using oxygen via nasal cannula.   NECK: Supple. No JVD. Trachea midline.   RESPIRATORY: Bilateral few expiratory wheezing present.   CARDIOVASCULAR: S1, S2 present, regular. No  murmur appreciated.   ABDOMEN: Soft, nontender. Bowel sounds present. No organomegaly appreciated.   LIMBS: No edema on the lower limbs. No joint swelling or tenderness.   NEUROLOGICAL: No gross motor or sensory problem appreciated.   LABORATORY, RADIOLOGICAL AND DIAGNOSTIC DATA: Glucose 99, BUN 22, creatinine 0.93, sodium 140, potassium 3.2, chloride 105, CO2 31, calcium 9.1. WBC 8.6, hemoglobin 11.3, MCV 87, platelets 266. Chest x-ray confirms finding of chronic obstructive pulmonary disease and lung fibrosis.   ASSESSMENT AND PLAN: An 81 year old female with history of chronic obstructive pulmonary disease and home oxygen use, having a visitor last week with flulike symptoms and since then worsening of her chronic obstructive pulmonary disease symptoms. She received a full dose five days erythromycin course, feeling a little better after that, still short of breath. 1. Chronic obstructive pulmonary disease exacerbation. We will give IV Solu-Medrol and DuoNeb nebulizer treatment every four hours and will continue her Spiriva as she is taking at home. I do not think any need of antibiotic at this time as she received full dose of erythromycin. I will leave it up to the primary care physician who is seeing her tomorrow. Will also check influenza antigens for confirming infection 2. Hypokalemia. We are replacing potassium and will check tomorrow morning to see the adequate  replacement.  3. Hypertension. We will continue metoprolol and amlodipine as home medication.  4. Hypercholesterolemia. We will continue simvastatin as home medication.  5. Deep vein thrombosis prophylaxis with heparin subcutaneous.  6. Gastrointestinal prophylaxis with omeprazole.  7. CODE STATUS: DO NOT RESUSCITATE and she is willing to keep her daughter as her healthcare proxy which I confirmed in her presence in the room when daughter is present. I advised her daughter to speak to the nurse and get the document signed once she  reaches the floor.   [facility] with Dr. Yates DecampJohn Walker. I will hand over to on-call [facility] doctor in the evening for further management.       TOTAL TIME SPENT IN HISTORY TAKING AND EXPLAINING THE PLAN TO THE FAMILY: 55 minutes.   ____________________________ Hope PigeonVaibhavkumar G. Elisabeth PigeonVachhani, MD vgv:ap D: 05/02/2012 21:11:22 ET T: 05/03/2012 10:00:26 ET JOB#: [id number]  cc: Hope PigeonVaibhavkumar G. Elisabeth PigeonVachhani, MD, <Dictator> John B. Danne HarborWalker III, MD Heath GoldVAIBHAVKUMAR [facility] MD ELECTRONICALLY SIGNED 05/25/2012 22:43

## 2015-01-02 ENCOUNTER — Inpatient Hospital Stay
Admission: EM | Admit: 2015-01-02 | Discharge: 2015-01-07 | DRG: 871 | Disposition: A | Discharge status: Skilled Nursing Facility | Payer: Medicare Other | Attending: Internal Medicine | Admitting: Internal Medicine

## 2015-01-02 ENCOUNTER — Encounter: Payer: Self-pay | Admitting: Emergency Medicine

## 2015-01-02 ENCOUNTER — Other Ambulatory Visit: Payer: Self-pay

## 2015-01-02 ENCOUNTER — Emergency Department: Payer: Medicare Other

## 2015-01-02 DIAGNOSIS — R627 Adult failure to thrive: Secondary | ICD-10-CM | POA: Diagnosis present

## 2015-01-02 DIAGNOSIS — R0600 Dyspnea, unspecified: Secondary | ICD-10-CM | POA: Diagnosis present

## 2015-01-02 DIAGNOSIS — Z9981 Dependence on supplemental oxygen: Secondary | ICD-10-CM

## 2015-01-02 DIAGNOSIS — N3001 Acute cystitis with hematuria: Secondary | ICD-10-CM | POA: Diagnosis present

## 2015-01-02 DIAGNOSIS — R0902 Hypoxemia: Secondary | ICD-10-CM

## 2015-01-02 DIAGNOSIS — H3530 Unspecified macular degeneration: Secondary | ICD-10-CM | POA: Diagnosis present

## 2015-01-02 DIAGNOSIS — R6510 Systemic inflammatory response syndrome (SIRS) of non-infectious origin without acute organ dysfunction: Secondary | ICD-10-CM

## 2015-01-02 DIAGNOSIS — E785 Hyperlipidemia, unspecified: Secondary | ICD-10-CM | POA: Diagnosis present

## 2015-01-02 DIAGNOSIS — L89601 Pressure ulcer of unspecified heel, stage 1: Secondary | ICD-10-CM | POA: Diagnosis present

## 2015-01-02 DIAGNOSIS — A419 Sepsis, unspecified organism: Secondary | ICD-10-CM | POA: Diagnosis not present

## 2015-01-02 DIAGNOSIS — G92 Toxic encephalopathy: Secondary | ICD-10-CM | POA: Diagnosis not present

## 2015-01-02 DIAGNOSIS — Z7401 Bed confinement status: Secondary | ICD-10-CM

## 2015-01-02 DIAGNOSIS — I10 Essential (primary) hypertension: Secondary | ICD-10-CM | POA: Diagnosis present

## 2015-01-02 DIAGNOSIS — N189 Chronic kidney disease, unspecified: Secondary | ICD-10-CM | POA: Diagnosis present

## 2015-01-02 DIAGNOSIS — J9621 Acute and chronic respiratory failure with hypoxia: Secondary | ICD-10-CM | POA: Diagnosis present

## 2015-01-02 DIAGNOSIS — Z6820 Body mass index (BMI) 20.0-20.9, adult: Secondary | ICD-10-CM

## 2015-01-02 DIAGNOSIS — J441 Chronic obstructive pulmonary disease with (acute) exacerbation: Secondary | ICD-10-CM | POA: Diagnosis present

## 2015-01-02 DIAGNOSIS — Z515 Encounter for palliative care: Secondary | ICD-10-CM

## 2015-01-02 DIAGNOSIS — D638 Anemia in other chronic diseases classified elsewhere: Secondary | ICD-10-CM | POA: Diagnosis present

## 2015-01-02 DIAGNOSIS — T43595A Adverse effect of other antipsychotics and neuroleptics, initial encounter: Secondary | ICD-10-CM | POA: Diagnosis present

## 2015-01-02 DIAGNOSIS — L8990 Pressure ulcer of unspecified site, unspecified stage: Secondary | ICD-10-CM | POA: Insufficient documentation

## 2015-01-02 DIAGNOSIS — F341 Dysthymic disorder: Secondary | ICD-10-CM | POA: Diagnosis present

## 2015-01-02 DIAGNOSIS — Z66 Do not resuscitate: Secondary | ICD-10-CM | POA: Diagnosis present

## 2015-01-02 DIAGNOSIS — Z8249 Family history of ischemic heart disease and other diseases of the circulatory system: Secondary | ICD-10-CM

## 2015-01-02 DIAGNOSIS — G4700 Insomnia, unspecified: Secondary | ICD-10-CM | POA: Diagnosis present

## 2015-01-02 DIAGNOSIS — N179 Acute kidney failure, unspecified: Secondary | ICD-10-CM | POA: Diagnosis present

## 2015-01-02 DIAGNOSIS — E43 Unspecified severe protein-calorie malnutrition: Secondary | ICD-10-CM | POA: Diagnosis present

## 2015-01-02 DIAGNOSIS — A4151 Sepsis due to Escherichia coli [E. coli]: Principal | ICD-10-CM | POA: Diagnosis present

## 2015-01-02 DIAGNOSIS — I129 Hypertensive chronic kidney disease with stage 1 through stage 4 chronic kidney disease, or unspecified chronic kidney disease: Secondary | ICD-10-CM | POA: Diagnosis present

## 2015-01-02 DIAGNOSIS — N390 Urinary tract infection, site not specified: Secondary | ICD-10-CM

## 2015-01-02 DIAGNOSIS — R338 Other retention of urine: Secondary | ICD-10-CM | POA: Diagnosis present

## 2015-01-02 DIAGNOSIS — G479 Sleep disorder, unspecified: Secondary | ICD-10-CM | POA: Diagnosis present

## 2015-01-02 DIAGNOSIS — Z825 Family history of asthma and other chronic lower respiratory diseases: Secondary | ICD-10-CM

## 2015-01-02 DIAGNOSIS — J449 Chronic obstructive pulmonary disease, unspecified: Secondary | ICD-10-CM | POA: Diagnosis present

## 2015-01-02 DIAGNOSIS — Z833 Family history of diabetes mellitus: Secondary | ICD-10-CM

## 2015-01-02 DIAGNOSIS — Z87891 Personal history of nicotine dependence: Secondary | ICD-10-CM | POA: Diagnosis not present

## 2015-01-02 DIAGNOSIS — D649 Anemia, unspecified: Secondary | ICD-10-CM | POA: Diagnosis present

## 2015-01-02 DIAGNOSIS — B9620 Unspecified Escherichia coli [E. coli] as the cause of diseases classified elsewhere: Secondary | ICD-10-CM | POA: Diagnosis not present

## 2015-01-02 DIAGNOSIS — J9620 Acute and chronic respiratory failure, unspecified whether with hypoxia or hypercapnia: Secondary | ICD-10-CM | POA: Diagnosis present

## 2015-01-02 LAB — BLOOD GAS, ARTERIAL
Acid-base deficit: 1.8 mmol/L (ref 0.0–2.0)
BICARBONATE: 23.7 meq/L (ref 21.0–28.0)
DELIVERY SYSTEMS: POSITIVE
Expiratory PAP: 5
FIO2: 0.4 %
Inspiratory PAP: 10
Mechanical Rate: 12
O2 Saturation: 97.6 %
PCO2 ART: 46 mmHg (ref 32.0–48.0)
PH ART: 7.33 — AB (ref 7.350–7.450)
PO2 ART: 115 mmHg — AB (ref 83.0–108.0)
Patient temperature: 39

## 2015-01-02 LAB — URINALYSIS COMPLETE WITH MICROSCOPIC (ARMC ONLY)
Bilirubin Urine: NEGATIVE
GLUCOSE, UA: NEGATIVE mg/dL
Nitrite: POSITIVE — AB
PROTEIN: 30 mg/dL — AB
SPECIFIC GRAVITY, URINE: 1.015 (ref 1.005–1.030)
pH: 5 (ref 5.0–8.0)

## 2015-01-02 LAB — CBC WITH DIFFERENTIAL/PLATELET
Basophils Absolute: 0.1 10*3/uL (ref 0–0.1)
Basophils Relative: 1 %
EOS ABS: 0 10*3/uL (ref 0–0.7)
Eosinophils Relative: 0 %
HEMATOCRIT: 34.2 % — AB (ref 35.0–47.0)
Hemoglobin: 10.9 g/dL — ABNORMAL LOW (ref 12.0–16.0)
LYMPHS PCT: 9 %
Lymphs Abs: 1.3 10*3/uL (ref 1.0–3.6)
MCH: 27.5 pg (ref 26.0–34.0)
MCHC: 31.9 g/dL — ABNORMAL LOW (ref 32.0–36.0)
MCV: 86.2 fL (ref 80.0–100.0)
Monocytes Absolute: 0.6 10*3/uL (ref 0.2–0.9)
Monocytes Relative: 4 %
NEUTROS PCT: 86 %
Neutro Abs: 11.9 10*3/uL — ABNORMAL HIGH (ref 1.4–6.5)
PLATELETS: 263 10*3/uL (ref 150–440)
RBC: 3.97 MIL/uL (ref 3.80–5.20)
RDW: 13.5 % (ref 11.5–14.5)
WBC: 13.9 10*3/uL — ABNORMAL HIGH (ref 3.6–11.0)

## 2015-01-02 LAB — COMPREHENSIVE METABOLIC PANEL
ALT: 8 U/L — ABNORMAL LOW (ref 14–54)
ANION GAP: 11 (ref 5–15)
AST: 24 U/L (ref 15–41)
Albumin: 3.4 g/dL — ABNORMAL LOW (ref 3.5–5.0)
Alkaline Phosphatase: 87 U/L (ref 38–126)
BUN: 20 mg/dL (ref 6–20)
CO2: 29 mmol/L (ref 22–32)
Calcium: 9.1 mg/dL (ref 8.9–10.3)
Chloride: 99 mmol/L — ABNORMAL LOW (ref 101–111)
Creatinine, Ser: 0.99 mg/dL (ref 0.44–1.00)
GFR calc non Af Amer: 49 mL/min — ABNORMAL LOW (ref >60)
GFR, EST AFRICAN AMERICAN: 57 mL/min — AB (ref >60)
Glucose, Bld: 155 mg/dL — ABNORMAL HIGH (ref 65–99)
POTASSIUM: 4.3 mmol/L (ref 3.5–5.1)
SODIUM: 139 mmol/L (ref 135–145)
TOTAL PROTEIN: 7.4 g/dL (ref 6.5–8.1)
Total Bilirubin: 0.6 mg/dL (ref 0.3–1.2)

## 2015-01-02 LAB — TROPONIN I: TROPONIN I: 0.03 ng/mL (ref <0.031)

## 2015-01-02 LAB — LACTIC ACID, PLASMA: LACTIC ACID, VENOUS: 1.3 mmol/L (ref 0.5–2.0)

## 2015-01-02 MED ORDER — FLUTICASONE PROPIONATE 50 MCG/ACT NA SUSP
2.0000 | Freq: Every day | NASAL | Status: DC
  Administered 2015-01-03 – 2015-01-07 (×5): 2 via NASAL
  Filled 2015-01-02 (×2): qty 16

## 2015-01-02 MED ORDER — CHLORHEXIDINE GLUCONATE 0.12 % MT SOLN
15.0000 mL | Freq: Two times a day (BID) | OROMUCOSAL | Status: DC
  Administered 2015-01-02 – 2015-01-04 (×3): 15 mL via OROMUCOSAL

## 2015-01-02 MED ORDER — QUETIAPINE FUMARATE 25 MG PO TABS
12.5000 mg | ORAL_TABLET | Freq: Every day | ORAL | Status: DC
  Administered 2015-01-02: 12.5 mg via ORAL
  Filled 2015-01-02: qty 1

## 2015-01-02 MED ORDER — POLYETHYLENE GLYCOL 3350 17 GM/SCOOP PO POWD: 1.0000 | Freq: Once | ORAL | Status: DC

## 2015-01-02 MED ORDER — SODIUM CHLORIDE 0.9 % IJ SOLN
3.0000 mL | Freq: Two times a day (BID) | INTRAMUSCULAR | Status: DC
  Administered 2015-01-02 – 2015-01-06 (×8): 3 mL via INTRAVENOUS

## 2015-01-02 MED ORDER — SODIUM CHLORIDE 0.9 % IV BOLUS (SEPSIS)
1000.0000 mL | Freq: Once | INTRAVENOUS | Status: AC
  Administered 2015-01-02: 1000 mL via INTRAVENOUS

## 2015-01-02 MED ORDER — ALBUTEROL SULFATE (2.5 MG/3ML) 0.083% IN NEBU
2.5000 mg | INHALATION_SOLUTION | Freq: Four times a day (QID) | RESPIRATORY_TRACT | Status: DC
  Administered 2015-01-02 – 2015-01-07 (×20): 2.5 mg via RESPIRATORY_TRACT
  Filled 2015-01-02 (×21): qty 3

## 2015-01-02 MED ORDER — IPRATROPIUM-ALBUTEROL 0.5-2.5 (3) MG/3ML IN SOLN: 3.0000 mL | RESPIRATORY_TRACT | Status: DC | PRN

## 2015-01-02 MED ORDER — ASPIRIN EC 81 MG PO TBEC
81.0000 mg | DELAYED_RELEASE_TABLET | Freq: Every day | ORAL | Status: DC
  Administered 2015-01-03 – 2015-01-07 (×5): 81 mg via ORAL
  Filled 2015-01-02 (×6): qty 1

## 2015-01-02 MED ORDER — SODIUM CHLORIDE 0.9 % IV SOLN
INTRAVENOUS | Status: DC
  Administered 2015-01-02: 06:00:00 via INTRAVENOUS

## 2015-01-02 MED ORDER — LEVOFLOXACIN IN D5W 750 MG/150ML IV SOLN
750.0000 mg | Freq: Once | INTRAVENOUS | Status: AC
  Administered 2015-01-02: 750 mg via INTRAVENOUS
  Filled 2015-01-02: qty 150

## 2015-01-02 MED ORDER — ENOXAPARIN SODIUM 40 MG/0.4ML ~~LOC~~ SOLN: 40.0000 mg | SUBCUTANEOUS | Status: DC

## 2015-01-02 MED ORDER — CETYLPYRIDINIUM CHLORIDE 0.05 % MT LIQD
7.0000 mL | Freq: Two times a day (BID) | OROMUCOSAL | Status: DC
Start: 2015-01-02 — End: 2015-01-07
  Administered 2015-01-03 – 2015-01-06 (×7): 7 mL via OROMUCOSAL

## 2015-01-02 MED ORDER — MOMETASONE FURO-FORMOTEROL FUM 100-5 MCG/ACT IN AERO
2.0000 | INHALATION_SPRAY | Freq: Two times a day (BID) | RESPIRATORY_TRACT | Status: DC
  Administered 2015-01-02 – 2015-01-04 (×4): 2 via RESPIRATORY_TRACT
  Filled 2015-01-02 (×2): qty 8.8

## 2015-01-02 MED ORDER — ACETAMINOPHEN 500 MG PO TABS: 500.0000 mg | ORAL_TABLET | Freq: Four times a day (QID) | ORAL | Status: DC | PRN

## 2015-01-02 MED ORDER — POLYETHYLENE GLYCOL 3350 17 G PO PACK
17.0000 g | PACK | Freq: Every day | ORAL | Status: DC | PRN
Start: 2015-01-02 — End: 2015-01-06

## 2015-01-02 MED ORDER — TIOTROPIUM BROMIDE MONOHYDRATE 18 MCG IN CAPS
18.0000 ug | ORAL_CAPSULE | Freq: Every day | RESPIRATORY_TRACT | Status: DC
  Administered 2015-01-03 – 2015-01-05 (×3): 18 ug via RESPIRATORY_TRACT
  Filled 2015-01-02 (×2): qty 5

## 2015-01-02 MED ORDER — TRAZODONE HCL 100 MG PO TABS: 100.0000 mg | ORAL_TABLET | Freq: Every day | ORAL | Status: DC

## 2015-01-02 MED ORDER — ONDANSETRON HCL 4 MG/2ML IJ SOLN: 4.0000 mg | Freq: Four times a day (QID) | INTRAMUSCULAR | Status: DC | PRN

## 2015-01-02 MED ORDER — ONDANSETRON HCL 4 MG PO TABS: 4.0000 mg | ORAL_TABLET | Freq: Four times a day (QID) | ORAL | Status: DC | PRN

## 2015-01-02 MED ORDER — LORATADINE 10 MG PO TABS
10.0000 mg | ORAL_TABLET | Freq: Every day | ORAL | Status: DC
  Administered 2015-01-03 – 2015-01-07 (×5): 10 mg via ORAL
  Filled 2015-01-02 (×6): qty 1

## 2015-01-02 MED ORDER — ACETAMINOPHEN 650 MG RE SUPP
650.0000 mg | Freq: Once | RECTAL | Status: AC
  Administered 2015-01-02: 650 mg via RECTAL
  Filled 2015-01-02: qty 1

## 2015-01-02 MED ORDER — PANTOPRAZOLE SODIUM 40 MG PO TBEC
40.0000 mg | DELAYED_RELEASE_TABLET | Freq: Every day | ORAL | Status: DC
  Administered 2015-01-03 – 2015-01-07 (×5): 40 mg via ORAL
  Filled 2015-01-02 (×6): qty 1

## 2015-01-02 MED ORDER — ENOXAPARIN SODIUM 30 MG/0.3ML ~~LOC~~ SOLN
30.0000 mg | SUBCUTANEOUS | Status: DC
  Administered 2015-01-02 – 2015-01-05 (×4): 30 mg via SUBCUTANEOUS
  Filled 2015-01-02 (×4): qty 0.3

## 2015-01-02 MED ORDER — LEVOFLOXACIN IN D5W 750 MG/150ML IV SOLN
750.0000 mg | INTRAVENOUS | Status: DC
  Administered 2015-01-04: 750 mg via INTRAVENOUS
  Filled 2015-01-02: qty 150

## 2015-01-02 MED ORDER — ADULT MULTIVITAMIN W/MINERALS CH
1.0000 | ORAL_TABLET | Freq: Every day | ORAL | Status: DC
  Administered 2015-01-03 – 2015-01-07 (×5): 1 via ORAL
  Filled 2015-01-02 (×6): qty 1

## 2015-01-02 MED ORDER — METHYLPREDNISOLONE SODIUM SUCC 125 MG IJ SOLR
60.0000 mg | Freq: Four times a day (QID) | INTRAMUSCULAR | Status: DC
  Administered 2015-01-02 – 2015-01-04 (×8): 60 mg via INTRAVENOUS
  Filled 2015-01-02 (×8): qty 2

## 2015-01-02 MED ORDER — MIRTAZAPINE 15 MG PO TABS
7.5000 mg | ORAL_TABLET | Freq: Every day | ORAL | Status: DC
  Filled 2015-01-02: qty 0.5

## 2015-01-02 NOTE — H&P (Signed)
[facility] - [place] at [facility]   PATIENT NAME: Rita Cox    MR#:  [id number]  DATE OF BIRTH:  [birth year]  DATE OF ADMISSION:  01/02/2015  PRIMARY CARE PHYSICIAN: Tillman Abide, MD   REQUESTING/REFERRING PHYSICIAN: Dr. Zenda Alpers  CHIEF COMPLAINT:   Chief Complaint  Patient presents with  . Shortness of Breath    HISTORY OF PRESENT ILLNESS:  Rita Cox  is a 81 y.o. female with a known history of COPD on oxygen 2 L/m, hypertension, CK D, chronic anemia, nursing home resident, on DO NOT RESUSCITATE status, brought in by EMS with the complaints of shortness of breath with O2 saturations in the upper 70s. According to the patient's daughter who is with the patient at this time, when she visited her last night she complained chest cold with congestion and later in the night was noted by the nursing home staff with shortness of breath with saturations in the upper 70s hence EMS was called who found the patient back in respiratory distress with hypoxia, was placed on O2 supplementation, given DuoNeb's and brought into the emergency room for further evaluation. In the emergency room on arrival patient was noted to be  in acute respiratory distress by the ED physician, was also tachycardic, was placed on BiPAP and was also noted to have a temperature of 102.46F. Workup revealed elevated WBC of 13.9, chronic anemia and urine analysis significant for UTI. Patient was given DuoNeb's, continued on oxygen supplementation through BiPAP, was given IV fluids for low blood pressure and started on IV Levaquin after obtaining blood and urine cultures. Patient's mental status started improving and she is currently alert awake and oriented and maintaining on the BiPAP with O2 saturation the mid 90s. No history of chest pain, nausea, vomiting, diarrhea, abdominal pain. Foley catheter was placed in the ED and is draining clear urine. Patient is on DO NOT RESUSCITATE status which is complete  performed with her daughter who is with the patient at this time and she is her healthcare power of attorney.  PAST MEDICAL HISTORY:   Past Medical History  Diagnosis Date  . COPD (chronic obstructive pulmonary disease)   . Hypertension   . Hyperlipidemia   . Impaired fasting glucose   . AAA (abdominal aortic aneurysm)   . Chronic renal insufficiency   . Anemia   . Sleep disturbance   . Dysthymia   . Macular degeneration     PAST SURGICAL HISTORY:   Past Surgical History  Procedure Laterality Date  . Abdominal aortic aneurysm repair w/ endoluminal graft      SOCIAL HISTORY:   History  Substance Use Topics  . Smoking status: Former Smoker    Quit date: 06/10/1998  . Smokeless tobacco: Never Used  . Alcohol Use: No    FAMILY HISTORY:   Family History  Problem Relation Age of Onset  . Diabetes Mother   . Cancer Father   . COPD Father   . Heart disease Brother   . Cancer Brother     unknown    DRUG ALLERGIES:  No Known Allergies  REVIEW OF SYSTEMS:   Review of Systems  Constitutional: Positive for malaise/fatigue. Negative for fever and chills.  HENT: Negative for ear pain, hearing loss, nosebleeds, sore throat and tinnitus.   Eyes: Negative for blurred vision, double vision, pain, discharge and redness.  Respiratory: Positive for cough and shortness of breath. Negative for hemoptysis, sputum production and wheezing.   Cardiovascular: Negative for chest  pain, palpitations, orthopnea and leg swelling.  Gastrointestinal: Negative for nausea, vomiting, abdominal pain, diarrhea, constipation, blood in stool and melena.  Genitourinary: Negative for dysuria, urgency, frequency and hematuria.  Musculoskeletal: Negative for back pain, joint pain and neck pain.  Skin: Negative for itching and rash.  Neurological: Negative for dizziness, tingling, sensory change, focal weakness and seizures.  Endo/Heme/Allergies: Does not bruise/bleed easily.  Psychiatric/Behavioral:  Positive for depression. The patient is not nervous/anxious.     MEDICATIONS AT HOME:   Prior to Admission medications   Medication Sig Start Date End Date Taking? Authorizing Provider  acetaminophen (TYLENOL) 325 MG tablet Take 650 mg by mouth 3 (three) times daily.   Yes Historical Provider, MD  acetaminophen (TYLENOL) 500 MG tablet Take 500 mg by mouth every 6 (six) hours as needed.   Yes Historical Provider, MD  ADVAIR DISKUS 250-50 MCG/DOSE AEPB USE 1 PUFF TWICE DAILY RINSE MOUTH AND SPIT AFTERWARDS. 06/20/14  Yes Karie Schwalbe, MD  albuterol (PROVENTIL HFA;VENTOLIN HFA) 108 (90 BASE) MCG/ACT inhaler Inhale 2 puffs into the lungs every 6 (six) hours as needed for wheezing or shortness of breath.   Yes Historical Provider, MD  amLODipine (NORVASC) 5 MG tablet Take 1 tablet (5 mg total) by mouth daily. 08/11/13  Yes Karie Schwalbe, MD  fluticasone (FLONASE) 50 MCG/ACT nasal spray Place 2 sprays into both nostrils daily.   Yes Historical Provider, MD  HYDROcodone-acetaminophen (NORCO/VICODIN) 5-325 MG per tablet Take 1 tablet by mouth 3 (three) times daily as needed for moderate pain.   Yes Historical Provider, MD  Infant Care Products (DERMACLOUD) CREA Apply 1 application topically 2 (two) times daily as needed. 03/16/14  Yes Karie Schwalbe, MD  ipratropium-albuterol (DUONEB) 0.5-2.5 (3) MG/3ML SOLN Take 3 mLs by nebulization every 4 (four) hours as needed.   Yes Historical Provider, MD  loratadine (CLARITIN) 10 MG tablet Take 10 mg by mouth daily.   Yes Historical Provider, MD  magnesium hydroxide (MILK OF MAGNESIA) 400 MG/5ML suspension Take 5 mLs by mouth daily as needed for mild constipation.   Yes Historical Provider, MD  metoprolol succinate (TOPROL-XL) 25 MG 24 hr tablet Take 12.5 mg by mouth 2 (two) times daily.   Yes Historical Provider, MD  mirtazapine (REMERON) 15 MG tablet Take 7.5 mg by mouth at bedtime.  08/18/13  Yes Karie Schwalbe, MD  Multiple Vitamins-Minerals  (MULTIVITAMIN WITH MINERALS) tablet Take 1 tablet by mouth daily.   Yes Historical Provider, MD  nystatin (MYCOSTATIN) powder Apply topically 2 (two) times daily as needed. For rash under breasts 01/05/14  Yes Karie Schwalbe, MD  omeprazole (PRILOSEC) 20 MG capsule Take 20 mg by mouth daily as needed.    Yes Historical Provider, MD  polyethylene glycol (MIRALAX / GLYCOLAX) packet Take 17 g by mouth daily as needed.    Yes Historical Provider, MD  polyethylene glycol powder (GLYCOLAX/MIRALAX) powder Take 1 Container by mouth once.   Yes Historical Provider, MD  sennosides-docusate sodium (SENOKOT-S) 8.6-50 MG tablet Take 1 tablet by mouth 2 (two) times daily.    Yes Historical Provider, MD  tiotropium (SPIRIVA) 18 MCG inhalation capsule Place 1 capsule (18 mcg total) into inhaler and inhale daily. 03/09/14  Yes Karie Schwalbe, MD  traZODone (DESYREL) 100 MG tablet TAKE ONE TABLET BY MOUTH AT BEDTIME AS NEEDED   Yes Karie Schwalbe, MD      VITAL SIGNS:  Blood pressure 106/55, pulse 113, temperature 102.2 F (39 C), temperature  source Rectal, resp. rate 28, height 5\' 3"  (1.6 m), weight 39.9 kg (87 lb 15.4 oz), SpO2 98 %.  PHYSICAL EXAMINATION:  Physical Exam  Constitutional: She is oriented to person, place, and time. She appears well-developed and well-nourished. No distress.  Thin built, chronically ill-appearing.  HENT:  Head: Normocephalic and atraumatic.  Right Ear: External ear normal.  Left Ear: External ear normal.  Nose: Nose normal.  Mouth/Throat: Oropharynx is clear and moist. No oropharyngeal exudate.  Eyes: EOM are normal. Pupils are equal, round, and reactive to light. No scleral icterus.  Neck: Normal range of motion. Neck supple. No JVD present. No thyromegaly present.  Cardiovascular: Normal rate, regular rhythm, normal heart sounds and intact distal pulses.  Exam reveals no friction rub.   No murmur heard. Respiratory: Effort normal and breath sounds normal. No  respiratory distress. She has no wheezes. She has no rales. She exhibits no tenderness.  GI: Soft. Bowel sounds are normal. She exhibits no distension and no mass. There is no tenderness. There is no rebound and no guarding.  Musculoskeletal: Normal range of motion. She exhibits no edema.  Lymphadenopathy:    She has no cervical adenopathy.  Neurological: She is alert and oriented to person, place, and time. She has normal reflexes. She displays normal reflexes. No cranial nerve deficit. She exhibits normal muscle tone.  Skin: Skin is warm. No rash noted. No erythema.  Psychiatric: She has a normal mood and affect. Her behavior is normal. Thought content normal.   LABORATORY PANEL:   CBC  Recent Labs Lab 01/02/15 0019  WBC 13.9*  HGB 10.9*  HCT 34.2*  PLT 263   ------------------------------------------------------------------------------------------------------------------  Chemistries   Recent Labs Lab 01/02/15 0019  NA 139  K 4.3  CL 99*  CO2 29  GLUCOSE 155*  BUN 20  CREATININE 0.99  CALCIUM 9.1  AST 24  ALT 8*  ALKPHOS 87  BILITOT 0.6   ------------------------------------------------------------------------------------------------------------------  Cardiac Enzymes  Recent Labs Lab 01/02/15 0019  TROPONINI 0.03   ------------------------------------------------------------------------------------------------------------------  RADIOLOGY:  Dg Chest Portable 1 View  01/02/2015   CLINICAL DATA:  Cough and dyspnea.  EXAM: PORTABLE CHEST - 1 VIEW  COMPARISON:  Sized 16 2010  FINDINGS: The lungs are hyperinflated with emphysema. Rounded calcifications projecting over the right lower lung zone are likely related to costochondral cartilage, and appears similar to prior exam. No confluent airspace disease. The heart size is normal, there is tortuosity of the thoracic aorta. Aortic stent graft is noted. No pneumothorax or large pleural effusion.  IMPRESSION: Advanced  emphysema. No evidence of superimposed acute process on portable chest radiograph. Chronic calcifications projecting over the right lower lung zone are likely related to costochondral cartilage.   Electronically Signed   By: Rubye Oaks M.D.   On: 01/02/2015 01:08    EKG:   Orders placed or performed in visit on 05/02/12  . EKG 12-Lead  Sinus tachycardia with ventricular rate of 1 39 bpm, history of depression in inferior leads.  IMPRESSION AND PLAN:   1. Sepsis due to urinary tract infection. 2. Acute on chronic respiratory failure secondary to severe COPD with superimposed acute bronchitis, rule out pneumonia. 3. Severe COPD. 4. Hypertension currently BP low. 5. CK D, creatinine now WNL. 6. Chronic anemia, stable.    Plan: Admit to MedSurg, continue O2 supplementation through BiPAP, follow-up O2 sats and transition to nasal cannula, vigorous DuoNeb's, IV Levaquin, follow-up blood and urine cultures, CBC, careful IV hydration, hold BP meds  for now because of low blood pressure, follow-up BP measurements.    All the records are reviewed and case discussed with ED provider. Management plans discussed with the patient, family and they are in agreement.  CODE STATUS: DO NOT RESUSCITATE  TOTAL TIME TAKING CARE OF THIS PATIENT: 50 minutes.    Jonnie Kind N M.D on 01/02/2015 at 2:56 AM  Between 7am to 6pm - Pager - [phone]  After 6pm go to [email or web address] - password [email or web address] [facility]  [place] [place] [facility]  Office  [phone]  CC: Primary care physician; Tillman Abide, MD

## 2015-01-02 NOTE — Plan of Care (Signed)
Problem: Discharge Progression Outcomes Goal: Tolerating diet Outcome: Progressing Poor appetite - encouragement given.  Goal: Other Discharge Outcomes/Goals Plan of care progress to goal:  No complaints of pain. IV fluids discontinued. IV Solumedrol given. Patient taken off of Bipap and placed on 6L [place] - patient was unable to tolerate. Patient is now on HFNC. Family remains at bedside.   Poor appetite - encouragement given. Patient needs assistance eating - family providing at this time.

## 2015-01-02 NOTE — Progress Notes (Signed)
ANTIBIOTIC CONSULT NOTE - INITIAL  Pharmacy Consult for [facility] Indication: Sepsis/UTI/?bronchitis  No Known Allergies  Patient Measurements: Height:  (160 cm) Weight: 87 lb 15.4 oz (39.9 kg) IBW/kg (Calculated) : 52.4 Adjusted Body Weight:   Vital Signs: Temp: 98.3 F (36.8 C) (07/25 0530) Temp Source: Axillary (07/25 0530) BP: 140/74 mmHg (07/25 0530) Pulse Rate: 115 (07/25 0530) Intake/Output from previous day: 07/24 0701 - 07/25 0700 In: -  Out: 250 [Urine:250] Intake/Output from this shift: Total I/O In: -  Out: 250 [Urine:250]  Labs:  Recent Labs  01/02/15 0019  WBC 13.9*  HGB 10.9*  PLT 263  CREATININE 0.99   Estimated Creatinine Clearance: 24.7 mL/min (by C-G formula based on Cr of 0.99).   Microbiology: No results found for this or any previous visit (from the past 720 hour(s)).  Medical History: Past Medical History  Diagnosis Date  . COPD (chronic obstructive pulmonary disease)   . Hypertension   . Hyperlipidemia   . Impaired fasting glucose   . AAA (abdominal aortic aneurysm)   . Chronic renal insufficiency   . Anemia   . Sleep disturbance   . Dysthymia   . Macular degeneration     Medications:  Scheduled:  . aspirin EC  81 mg Oral Daily  . enoxaparin (LOVENOX) injection  30 mg Subcutaneous Q24H  . fluticasone  2 spray Each Nare Daily  . [START ON 01/04/2015] levofloxacin (LEVAQUIN) IV  750 mg Intravenous Q48H  . loratadine  10 mg Oral Daily  . mirtazapine  7.5 mg Oral QHS  . mometasone-formoterol  2 puff Inhalation BID  . multivitamin with minerals  1 tablet Oral Daily  . pantoprazole  40 mg Oral Daily  . polyethylene glycol powder  1 Container Oral Once  . sodium chloride  3 mL Intravenous Q12H  . tiotropium  18 mcg Inhalation Daily  . traZODone  100 mg Oral QHS   Anti-infectives    Start     Dose/Rate Route Frequency Ordered Stop   01/04/15 1800  levofloxacin (LEVAQUIN) IVPB 750 mg     750 mg 100 mL/hr over 90 Minutes  Intravenous Every 48 hours 01/02/15 0540     01/02/15 0115  levofloxacin (LEVAQUIN) IVPB 750 mg     750 mg 100 mL/hr over 90 Minutes Intravenous  Once 01/02/15 0110 01/02/15 0249     Assessment: 81 yo F with sepsis/UTI. Hx COPD   Plan:  Follow up culture results Will continue with Levaquin  IV Q48h. (Patient received 1st dose in ER).   Tamiyah Moulin A 01/02/2015,5:41 AM

## 2015-01-02 NOTE — Progress Notes (Signed)
Patient has been transferred to room 117 from ed. Took patient off bipap and placed her on 2liter nasal cannula(RN). Patient was in no distress. Breathing much better at this time. No sob noted. o2 saturation noted at 96% on nasal cannula. Bipap transported to room. On standby if needed.

## 2015-01-02 NOTE — ED Notes (Signed)
Pt from [facility].  Pt found at 75% o2.  For EMS.  Pt has nonprod cough and diminshed upper lobes

## 2015-01-02 NOTE — Clinical Social Work Note (Signed)
Clinical Social Work Assessment  Patient Details  Name: Rita Cox MRN: [id number] Date of Birth: 1924-07-13  Date of referral:  01/02/15               Reason for consult:  Facility Placement                Permission sought to share information with:  Family Supports Permission granted to share information::  Yes, Verbal Permission Granted  Name::      (son and daughter in law)   Housing/Transportation Living arrangements for the past 2 months:  [place] of Information:  Adult Children Patient Interpreter Needed:  None Criminal Activity/Legal Involvement Pertinent to Current Situation/Hospitalization:  Yes Significant Relationships:  Adult Children Lives with:  Facility Resident Do you feel safe going back to the place where you live?  Yes Need for family participation in patient care:  Yes (Comment)  Care giving concerns:  No care giver concerns   Social Worker assessment / plan:  CSW met with pt and family to address consult. Pt is a long term care patient at [facility]. Pt is pleasantly confused. Pt's family would like pt to return [facility] at discharge. Pt's family expressed an interest in [place] at facility. CSW updated MD as well as made referral to [facility]. CSW called [facility] and pt is able to return. FL2 will be complete and will be on the chart. CSW will continue to follow.   Employment status:  Retired Forensic scientist:  Medicare PT Recommendations:   Not assessed at time of note.  Information / Referral to community resources:  [place], Other (Comment Required) (Hospice)  Patient/Family's Response to care:  Pt's family was pleasant and agreeable to discharge plan.   Patient/Family's Understanding of and Emotional Response to Diagnosis, Current Treatment, and Prognosis:  Pt's family understands that pt will need to return facility at discharge.   Emotional Assessment Appearance:  Appears stated  age Attitude/Demeanor/Rapport:  Other (Pleasant) Affect (typically observed):  Appropriate Orientation:  Fluctuating Orientation (Suspected and/or reported Sundowners) Alcohol / Substance use:  Never Used Psych involvement (Current and /or in the community):  No (Comment)  Discharge Needs  Concerns to be addressed:  Adjustment to Illness Readmission within the last 30 days:  Yes Current discharge risk:  None Barriers to Discharge:  No Barriers Identified   Darden Dates, LCSW 01/02/2015, 2:38 PM

## 2015-01-02 NOTE — Progress Notes (Signed)
Patient ID: Rita Cox, female   DOB: 1924/09/02, 81 y.o.   MRN: [id number] [facility] PROGRESS NOTE  PCP: Tillman Abide, MD  HPI/Subjective: Patient was brought in because she thought she was going to die. She feels short of breath. Cough but nonproductive. Some abdominal pain.  Objective: Filed Vitals:   01/02/15 1354  BP: 140/57  Pulse: 107  Temp: 97.8 F (36.6 C)  Resp: 20    Intake/Output Summary (Last 24 hours) at 01/02/15 1421 Last data filed at 01/02/15 1419  Gross per 24 hour  Intake 1092.5 ml  Output    250 ml  Net  842.5 ml   Filed Weights   01/02/15 0017  Weight: 39.9 kg (87 lb 15.4 oz)    ROS: Review of Systems  Constitutional: Negative for fever and chills.  Eyes: Negative for blurred vision.  Respiratory: Positive for cough and shortness of breath. Negative for sputum production.   Cardiovascular: Negative for chest pain.  Gastrointestinal: Positive for abdominal pain. Negative for nausea, vomiting, diarrhea and constipation.  Genitourinary: Negative for dysuria.  Musculoskeletal: Negative for joint pain.  Neurological: Negative for dizziness and headaches.   Exam: Physical Exam  HENT:  Nose: No mucosal edema.  Mouth/Throat: No oropharyngeal exudate or posterior oropharyngeal edema.  Eyes: Conjunctivae, EOM and lids are normal. Pupils are equal, round, and reactive to light.  Neck: No JVD present. Carotid bruit is not present. No edema present. No thyroid mass and no thyromegaly present.  Cardiovascular: S1 normal and S2 normal.  Tachycardia present.  Exam reveals no gallop.   No murmur heard. Pulses:      Dorsalis pedis pulses are 1+ on the right side, and 1+ on the left side.  Respiratory: Accessory muscle usage present. No respiratory distress. She has decreased breath sounds in the right upper field, the right middle field, the right lower field, the left upper field, the left middle field and the left lower field. She has wheezes  in the right middle field, the right lower field, the left middle field and the left lower field. She has no rhonchi. She has no rales.  GI: Soft. Bowel sounds are normal. There is no tenderness.  Musculoskeletal:       Right ankle: She exhibits no swelling.       Left ankle: She exhibits no swelling.  Lymphadenopathy:    She has no cervical adenopathy.  Neurological: She is alert. No cranial nerve deficit.  Skin: Skin is warm. Nails show no clubbing.  Bilateral ankles covered. As per nurse stage I decubiti on one of the heels. The other one is just for protection.  Psychiatric: She has a normal mood and affect.    Data Reviewed: Basic Metabolic Panel:  Recent Labs Lab 01/02/15 0019  NA 139  K 4.3  CL 99*  CO2 29  GLUCOSE 155*  BUN 20  CREATININE 0.99  CALCIUM 9.1   Liver Function Tests:  Recent Labs Lab 01/02/15 0019  AST 24  ALT 8*  ALKPHOS 87  BILITOT 0.6  PROT 7.4  ALBUMIN 3.4*   CBC:  Recent Labs Lab 01/02/15 0019  WBC 13.9*  NEUTROABS 11.9*  HGB 10.9*  HCT 34.2*  MCV 86.2  PLT 263     Recent Results (from the past 240 hour(s))  Blood culture (routine x 2)     Status: None (Preliminary result)   Collection Time: 01/02/15 12:19 AM  Result Value Ref Range Status   Specimen Description BLOOD RIGHT  ASSIST CONTROL  Final   Special Requests BOTTLES DRAWN AEROBIC AND ANAEROBIC 7CC  Final   Culture NO GROWTH < 12 HOURS  Final   Report Status PENDING  Incomplete  Blood culture (routine x 2)     Status: None (Preliminary result)   Collection Time: 01/02/15  1:00 AM  Result Value Ref Range Status   Specimen Description BLOOD RIGHT ASSIST CONTROL  Final   Special Requests BOTTLES DRAWN AEROBIC AND ANAEROBIC 7CC  Final   Culture NO GROWTH < 12 HOURS  Final   Report Status PENDING  Incomplete     Studies: Dg Chest Portable 1 View  01/02/2015   CLINICAL DATA:  Cough and dyspnea.  EXAM: PORTABLE CHEST - 1 VIEW  COMPARISON:  Sized 16 2010  FINDINGS: The  lungs are hyperinflated with emphysema. Rounded calcifications projecting over the right lower lung zone are likely related to costochondral cartilage, and appears similar to prior exam. No confluent airspace disease. The heart size is normal, there is tortuosity of the thoracic aorta. Aortic stent graft is noted. No pneumothorax or large pleural effusion.  IMPRESSION: Advanced emphysema. No evidence of superimposed acute process on portable chest radiograph. Chronic calcifications projecting over the right lower lung zone are likely related to costochondral cartilage.   Electronically Signed   By: Rubye Oaks M.D.   On: 01/02/2015 01:08    Scheduled Meds: . albuterol  2.5 mg Nebulization QID  . antiseptic oral rinse  7 mL Mouth Rinse q12n4p  . aspirin EC  81 mg Oral Daily  . chlorhexidine  15 mL Mouth Rinse BID  . enoxaparin (LOVENOX) injection  30 mg Subcutaneous Q24H  . fluticasone  2 spray Each Nare Daily  . [START ON 01/04/2015] levofloxacin (LEVAQUIN) IV  750 mg Intravenous Q48H  . loratadine  10 mg Oral Daily  . methylPREDNISolone (SOLU-MEDROL) injection  60 mg Intravenous Q6H  . mirtazapine  7.5 mg Oral QHS  . mometasone-formoterol  2 puff Inhalation BID  . multivitamin with minerals  1 tablet Oral Daily  . pantoprazole  40 mg Oral Daily  . sodium chloride  3 mL Intravenous Q12H  . tiotropium  18 mcg Inhalation Daily  . traZODone  100 mg Oral QHS    Assessment/Plan:  1. Acute on chronic respiratory failure with hypoxia- currently on 40% BiPAP in order to move air. 2. COPD exacerbation- start Solu-Medrol 60 mg every 6 hours and continue nebulizers and inhalers. 3. Clinical sepsis- DC IV fluids. Follow up cultures. Agree with empiric Levaquin for now. Acute cystitis with hematuria. 4. History of peripheral vascular disease. 5. Insomnia- trial of Seroquel DC trazodone and Remeron. 6. Failure to thrive, bedbound, poor mobility and poor appetite. Family interested in hospice back  at their facility.  Code Status:     Code Status Orders        Start     Ordered   01/02/15 0527  Do not attempt resuscitation (DNR)   Continuous    Question Answer Comment  In the event of cardiac or respiratory ARREST Do not call a "code blue"   In the event of cardiac or respiratory ARREST Do not perform Intubation, CPR, defibrillation or ACLS   In the event of cardiac or respiratory ARREST Use medication by any route, position, wound care, and other measures to relive pain and suffering. May use oxygen, suction and manual treatment of airway obstruction as needed for comfort.   Comments RN may pronounce death  01/02/15 0526    Advance Directive Documentation        Most Recent Value   Type of Advance Directive  Out of facility DNR (pink MOST or yellow form)   Pre-existing out of facility DNR order (yellow form or pink MOST form)  Yellow form placed in chart (order not valid for inpatient use)   "MOST" Form in Place?       Family Communication: Family at bedside.  Disposition Plan: Back to facility with hospice  Antibiotics:  Levaquin  Time spent: 35 minutes  Alford Highland  [facility]

## 2015-01-02 NOTE — ED Provider Notes (Signed)
[facility] Emergency Department Provider Note  ____________________________________________  Time seen: Approximately 1:13 AM  I have reviewed the triage vital signs and the nursing notes.   HISTORY  Chief Complaint Shortness of Breath  History limited by the patient's acute distress  HPI Rita Cox is a 81 y.o. female who comes in tonight with some shortness of breath. The patient lives at a nursing home and one of the nurses noted at the beginning of her shift that the patient appeared short of breath. EMS was called this evening as the patient's oxygen saturations were low. Per the nurse's they placed O2 on the patient although she is on O2 at baseline. The oxygen saturations initially were in the 70s and then into the 80s after the oxygen was placed. EMS gave the patient to duo nebs but did not give any steroids. They report that her saturations improved. The patient though does continue to appear in respiratory distress. She reports that she does not have any chest pain but does have some mild abdominal pain. The patient has some shortness of breath and is breathing fast. There was no report of a fever at the nursing home but the patient does feel warm. The patient reports that she just feels weak all over.   Past Medical History  Diagnosis Date  . COPD (chronic obstructive pulmonary disease)   . Hypertension   . Hyperlipidemia   . Impaired fasting glucose   . AAA (abdominal aortic aneurysm)   . Chronic renal insufficiency   . Anemia   . Sleep disturbance   . Dysthymia   . Macular degeneration     Patient Active Problem List   Diagnosis Date Noted  . Osteoarthritis, multiple sites 03/16/2014  . Sleep disturbance   . Episodic mood disorder   . COPD (chronic obstructive pulmonary disease)   . Hypertension   . Hyperlipidemia   . Impaired fasting glucose   . AAA (abdominal aortic aneurysm)   . Chronic renal insufficiency   . Anemia     Past  Surgical History  Procedure Laterality Date  . Abdominal aortic aneurysm repair w/ endoluminal graft      Current Outpatient Rx  Name  Route  Sig  Dispense  Refill  . acetaminophen (TYLENOL) 500 MG tablet   Oral   Take 500 mg by mouth every 6 (six) hours as needed.         Marland Kitchen ADVAIR DISKUS 250-50 MCG/DOSE AEPB      USE 1 PUFF TWICE DAILY RINSE MOUTH AND SPIT AFTERWARDS.   60 each   11   . albuterol (PROVENTIL HFA;VENTOLIN HFA) 108 (90 BASE) MCG/ACT inhaler   Inhalation   Inhale 2 puffs into the lungs every 6 (six) hours as needed for wheezing or shortness of breath.         Marland Kitchen amLODipine (NORVASC) 5 MG tablet   Oral   Take 1 tablet (5 mg total) by mouth daily.   90 tablet   3   . HYDROcodone-acetaminophen (NORCO/VICODIN) 5-325 MG per tablet   Oral   Take 1 tablet by mouth 3 (three) times daily as needed for moderate pain.         . Infant Care Products (DERMACLOUD) CREA   Topical   Apply 1 application topically 2 (two) times daily as needed.   430 g   5   . ipratropium-albuterol (DUONEB) 0.5-2.5 (3) MG/3ML SOLN   Nebulization   Take 3 mLs by nebulization every 4 (  four) hours as needed.         . metoprolol succinate (TOPROL-XL) 25 MG 24 hr tablet   Oral   Take 12.5 mg by mouth 2 (two) times daily.         . mirtazapine (REMERON) 15 MG tablet   Oral   Take 7.5 mg by mouth at bedtime.          . Multiple Vitamins-Minerals (MULTIVITAMIN WITH MINERALS) tablet   Oral   Take 1 tablet by mouth daily.         Marland Kitchen nystatin (MYCOSTATIN) powder   Topical   Apply topically 2 (two) times daily as needed. For rash under breasts   60 g   11   . omeprazole (PRILOSEC) 20 MG capsule   Oral   Take 20 mg by mouth daily as needed.          . polyethylene glycol (MIRALAX / GLYCOLAX) packet   Oral   Take 17 g by mouth daily as needed.          . polyethylene glycol powder (GLYCOLAX/MIRALAX) powder   Oral   Take 1 Container by mouth once.         .  sennosides-docusate sodium (SENOKOT-S) 8.6-50 MG tablet   Oral   Take 1 tablet by mouth 2 (two) times daily.          Marland Kitchen tiotropium (SPIRIVA) 18 MCG inhalation capsule   Inhalation   Place 1 capsule (18 mcg total) into inhaler and inhale daily.   30 capsule   5   . traZODone (DESYREL) 100 MG tablet      TAKE ONE TABLET BY MOUTH AT BEDTIME AS NEEDED   30 tablet   11     Allergies Review of patient's allergies indicates no known allergies.  Family History  Problem Relation Age of Onset  . Diabetes Mother   . Cancer Father   . COPD Father   . Heart disease Brother   . Cancer Brother     unknown    Social History History  Substance Use Topics  . Smoking status: Former Smoker    Quit date: 06/10/1998  . Smokeless tobacco: Never Used  . Alcohol Use: No    Review of Systems Constitutional: No fever/chills Eyes: No visual changes. ENT: No sore throat. Cardiovascular: Denies chest pain. Respiratory:  shortness of breath and cough Gastrointestinal:  abdominal pain.  No nausea, no vomiting.   Genitourinary: Negative for dysuria. Musculoskeletal: Negative for back pain. Skin: Negative for rash. Neurological: Generalized weakness  10-point ROS otherwise negative.  ____________________________________________   PHYSICAL EXAM:  VITAL SIGNS: ED Triage Vitals  Enc Vitals Group     BP 01/02/15 0017 103/82 mmHg     Pulse Rate 01/02/15 0017 137     Resp 01/02/15 0017 22     Temp 01/02/15 0044 102.2 F (39 C)     Temp Source 01/02/15 0044 Rectal     SpO2 01/02/15 0009 75 %     Weight 01/02/15 0017 87 lb 15.4 oz (39.9 kg)     Height 01/02/15 0017 5\' 3"  (1.6 m)     Head Cir --      Peak Flow --      Pain Score --      Pain Loc --      Pain Edu? --      Excl. in GC? --     Constitutional: Alert and oriented. Ill appearing and in moderate distress.  Eyes: Conjunctivae are normal. PERRL. EOMI. Head: Atraumatic. Nose: No congestion/rhinnorhea. Mouth/Throat:  Mucous membranes are moist.  Oropharynx non-erythematous. Cardiovascular: Tachycardic, regular rhythm. Grossly normal heart sounds.  Good peripheral circulation. Respiratory: Increased respiratory effort.  Coarse and crackles throughout all fields Gastrointestinal: Soft and nontender. No distention. Positive bowel sounds Genitourinary: deferred Musculoskeletal: No lower extremity tenderness nor edema.   Neurologic:  Normal speech and language. No gross focal neurologic deficits are appreciated.  Skin:  Skin is warm, dry and intact.  Psychiatric: Mood and affect are normal.   ____________________________________________   LABS (all labs ordered are listed, but only abnormal results are displayed)  Labs Reviewed  CBC WITH DIFFERENTIAL/PLATELET - Abnormal; Notable for the following:    WBC 13.9 (*)    Hemoglobin 10.9 (*)    HCT 34.2 (*)    MCHC 31.9 (*)    Neutro Abs 11.9 (*)    All other components within normal limits  COMPREHENSIVE METABOLIC PANEL - Abnormal; Notable for the following:    Chloride 99 (*)    Glucose, Bld 155 (*)    Albumin 3.4 (*)    ALT 8 (*)    GFR calc non Af Amer 49 (*)    GFR calc Af Amer 57 (*)    All other components within normal limits  BLOOD GAS, ARTERIAL - Abnormal; Notable for the following:    pH, Arterial 7.33 (*)    pO2, Arterial 115 (*)    All other components within normal limits  URINALYSIS COMPLETEWITH MICROSCOPIC ([facility] ONLY) - Abnormal; Notable for the following:    Color, Urine AMBER (*)    APPearance TURBID (*)    Ketones, ur 1+ (*)    Hgb urine dipstick 2+ (*)    Protein, ur 30 (*)    Nitrite POSITIVE (*)    Leukocytes, UA 3+ (*)    Bacteria, UA MANY (*)    Squamous Epithelial / LPF 0-5 (*)    All other components within normal limits  CULTURE, BLOOD (ROUTINE X 2)  CULTURE, BLOOD (ROUTINE X 2)  URINE CULTURE  TROPONIN I  LACTIC ACID, PLASMA  LACTIC ACID, PLASMA   ____________________________________________  EKG  ED ECG  REPORT I, Rebecka Apley, the attending physician, personally viewed and interpreted this ECG.   Date: 01/02/2015  EKG Time: 0015  Rate: 139  Rhythm: sinus tachycardia  Axis: normal  Intervals:none  ST&T Change: ST depression in leads II, III and avf, v3-v6  ____________________________________________  RADIOLOGY  Chest x-ray: Advanced emphysema with no evidence of superimposed acute process on portable chest radiograph. ____________________________________________   PROCEDURES  Procedure(s) performed: None  Critical Care performed: Yes, see critical care note(s)  CRITICAL CARE Performed by: Lucrezia Europe P   Total critical care time: 45 minutes  Critical care time was exclusive of separately billable procedures and treating other patients.  Critical care was necessary to treat or prevent imminent or life-threatening deterioration.  Critical care was time spent personally by me on the following activities: development of treatment plan with patient and/or surrogate as well as nursing, discussions with consultants, evaluation of patient's response to treatment, examination of patient, obtaining history from patient or surrogate, ordering and performing treatments and interventions, ordering and review of laboratory studies, ordering and review of radiographic studies, pulse oximetry and re-evaluation of patient's condition.  ____________________________________________   INITIAL IMPRESSION / ASSESSMENT AND PLAN / ED COURSE  Pertinent labs & imaging results that were available during my care of  the patient were reviewed by me and considered in my medical decision making (see chart for details).  This is an 81 year old female who comes in with shortness of breath today. On initial evaluation the patient did have some crackles in her lungs and had some increased work of breathing so she was placed on BiPAP when she initially arrived. The patient was also  significantly tachycardic so a liter of normal saline was ordered and started on the patient. Once I did receive the results of her blood work the patient was ordered a Tylenol suppository as she was on BiPAP and she was ordered a liter of normal saline. A Foley catheter was placed as the patient felt generalized weakness. On reassessment the patient reports that she feels her work of breathing is improved with the BiPAP and she appears more aroused. The patient will be admitted to the hospital for shortness of breath, hypoxia, fever, tachycardia and a urinary tract infection. She also did receive a second liter of normal saline and level floxacillin for her infection. ____________________________________________   FINAL CLINICAL IMPRESSION(S) / ED DIAGNOSES  Final diagnoses:  Acute cystitis with hematuria  Systemic inflammatory response syndrome (SIRS)  Hypoxia  Dyspnea      Rebecka Apley, MD 01/02/15 [id number]

## 2015-01-02 NOTE — Progress Notes (Signed)
Placed back on bipap as O2 sat dropped to 79 on 5 lpm

## 2015-01-02 NOTE — Progress Notes (Signed)
Initial Nutrition Assessment   INTERVENTION:   Meals and Snacks: Cater to patient preferences Medical Food Supplement Therapy: will send Mighty Shake at breakfast and Magic Cup at lunch and dinner (each supplement provides approximately 300kcals and 9g protein)  NUTRITION DIAGNOSIS:   Inadequate oral intake related to acute illness, poor appetite as evidenced by meal completion < 25%, per patient/family report.  GOAL:   Patient will meet greater than or equal to 90% of their needs  MONITOR:    (Energy Intake, Anthropometrics, Skin, Electrolyte and renal Profile, Digestive system)  REASON FOR ASSESSMENT:    (RD Screen, Low BMI)    ASSESSMENT:   Pt admitted with sepsis secondary to UTI. Pt son reports pt has been nonambulatory for the past 3 years per Lance Bosch, Charity fundraiser. Pt on Bipap, 6L [place] this am on visit.  Past Medical History  Diagnosis Date  . COPD (chronic obstructive pulmonary disease)   . Hypertension   . Hyperlipidemia   . Impaired fasting glucose   . AAA (abdominal aortic aneurysm)   . Chronic renal insufficiency   . Anemia   . Sleep disturbance   . Dysthymia   . Macular degeneration      Diet Order:  Diet Heart Room service appropriate?: Yes; Fluid consistency:: Thin   Current Nutrition: Per son, pt ate bites of oatmeal and banana this am for breakfast. Pt asleep/lethargic on visit this am.   Food/Nutrition-Related History: Per son pt has been eating very little for the past few weeks. Pt son reports pt has drank Ensure in the past but does not like. Son also confirms no difficulty swallowing.    Medications: NS at 50ml/hr, MVI, Protonix, Remeron  Electrolyte/Renal Profile and Glucose Profile:   Recent Labs Lab 01/02/15 0019  NA 139  K 4.3  CL 99*  CO2 29  BUN 20  CREATININE 0.99  CALCIUM 9.1  GLUCOSE 155*   Protein Profile:  Recent Labs Lab 01/02/15 0019  ALBUMIN 3.4*    Gastrointestinal Profile: Last BM:   01/01/2015  Skin:   Stage I, heel  pressure ulcer  Nutrition-Focused Physical Exam Findings:  Unable to complete Nutrition-Focused physical exam at this time; RD attempted brief physical assessment and of note mild/moderate muscle wasting of shoulder but unable to fully assess due to pt status.   Weight Change: Pt son reports stable weight that he is aware of   Height:   Ht Readings from Last 1 Encounters:  01/02/15  (1.6 m)    Weight:   Wt Readings from Last 1 Encounters:  01/02/15 87 lb 15.4 oz (39.9 kg)    Ideal Body Weight:  52.3 kg  Wt Readings from Last 10 Encounters:  01/02/15 87 lb 15.4 oz (39.9 kg)    BMI:  Body mass index is 15.59 kg/(m^2).  Estimated Nutritional Needs:   Kcal:  1217-1438kcals, BEE: 922kcals, TEE: (IF 1.1-1.3)(AF 1.2) using IBW of 52.3kg  Protein:  52-63g protein (1.0-1.2g/kg) using IBW of 52.3kg  Fluid:  1308-1553mL of fluid (25-58mL/kg) using IBW of 52.3kg  EDUCATION NEEDS:   Education needs no appropriate at this time  HIGH Care Level  Leda Quail, RD, LDN Pager [phone]

## 2015-01-03 MED ORDER — MORPHINE SULFATE (CONCENTRATE) 10 MG/0.5ML PO SOLN
5.0000 mg | ORAL | Status: DC | PRN
  Administered 2015-01-03 (×2): 5 mg via ORAL
  Administered 2015-01-04: 10 mg via ORAL
  Administered 2015-01-04: 5 mg via ORAL
  Administered 2015-01-05 – 2015-01-07 (×5): 10 mg via ORAL
  Filled 2015-01-03 (×9): qty 0.5

## 2015-01-03 MED ORDER — MORPHINE SULFATE (CONCENTRATE) 10 MG/0.5ML PO SOLN
10.0000 mg | ORAL | Status: DC | PRN
  Administered 2015-01-03: 5 mg via ORAL
  Filled 2015-01-03: qty 0.5

## 2015-01-03 MED ORDER — QUETIAPINE FUMARATE 25 MG PO TABS: 25.0000 mg | ORAL_TABLET | Freq: Every day | ORAL | Status: DC

## 2015-01-03 NOTE — Progress Notes (Signed)
Patient ID: Rita Cox, female   DOB: 1924/11/12, 81 y.o.   MRN: [id number] [facility] PROGRESS NOTE  PCP: Tillman Abide, MD  HPI/Subjective: Patient feels short of breath. Still not sleeping well. Some cough. Patient currently on 48% high flow nasal cannula.  Objective: Filed Vitals:   01/03/15 0507  BP: 130/66  Pulse: 124  Temp: 96.9 F (36.1 C)  Resp: 22    Filed Weights   01/02/15 0017 01/03/15 0507  Weight: 39.9 kg (87 lb 15.4 oz) 47.945 kg (105 lb 11.2 oz)    ROS: Review of Systems  Constitutional: Negative for fever and chills.  Eyes: Negative for blurred vision.  Respiratory: Positive for cough and shortness of breath. Negative for sputum production.   Cardiovascular: Negative for chest pain.  Gastrointestinal: Negative for nausea, vomiting, abdominal pain, diarrhea and constipation.  Genitourinary: Negative for dysuria.  Musculoskeletal: Negative for joint pain.  Neurological: Negative for dizziness and headaches.   Exam: Physical Exam  HENT:  Nose: No mucosal edema.  Mouth/Throat: No oropharyngeal exudate or posterior oropharyngeal edema.  Eyes: Conjunctivae, EOM and lids are normal. Pupils are equal, round, and reactive to light.  Neck: No JVD present. Carotid bruit is not present. No edema present. No thyroid mass and no thyromegaly present.  Cardiovascular: S1 normal and S2 normal.  Tachycardia present.  Exam reveals no gallop.   No murmur heard. Pulses:      Dorsalis pedis pulses are 1+ on the right side, and 1+ on the left side.  Respiratory: No accessory muscle usage. No respiratory distress. She has decreased breath sounds in the right upper field, the right middle field, the right lower field, the left upper field, the left middle field and the left lower field. She has wheezes in the right middle field, the right lower field, the left middle field and the left lower field. She has no rhonchi. She has no rales.  GI: Soft. Bowel sounds are  normal. There is no tenderness.  Musculoskeletal:       Right ankle: She exhibits no swelling.       Left ankle: She exhibits no swelling.  Lymphadenopathy:    She has no cervical adenopathy.  Neurological: She is alert. No cranial nerve deficit.  Skin: Skin is warm. Nails show no clubbing.  Psychiatric: She has a normal mood and affect.    Data Reviewed: Basic Metabolic Panel:  Recent Labs Lab 01/02/15 0019  NA 139  K 4.3  CL 99*  CO2 29  GLUCOSE 155*  BUN 20  CREATININE 0.99  CALCIUM 9.1   Liver Function Tests:  Recent Labs Lab 01/02/15 0019  AST 24  ALT 8*  ALKPHOS 87  BILITOT 0.6  PROT 7.4  ALBUMIN 3.4*   CBC:  Recent Labs Lab 01/02/15 0019  WBC 13.9*  NEUTROABS 11.9*  HGB 10.9*  HCT 34.2*  MCV 86.2  PLT 263     Recent Results (from the past 240 hour(s))  Blood culture (routine x 2)     Status: None (Preliminary result)   Collection Time: 01/02/15 12:19 AM  Result Value Ref Range Status   Specimen Description BLOOD RIGHT ASSIST CONTROL  Final   Special Requests BOTTLES DRAWN AEROBIC AND ANAEROBIC 7CC  Final   Culture NO GROWTH 1 DAY  Final   Report Status PENDING  Incomplete  Urine culture     Status: None (Preliminary result)   Collection Time: 01/02/15 12:43 AM  Result Value Ref Range Status  Specimen Description URINE, CLEAN CATCH  Final   Special Requests Normal  Final   Culture   Final    >=100,000 COLONIES/mL ESCHERICHIA COLI SUSCEPTIBILITIES TO FOLLOW    Report Status PENDING  Incomplete  Blood culture (routine x 2)     Status: None (Preliminary result)   Collection Time: Jan 08, 2015  1:00 AM  Result Value Ref Range Status   Specimen Description BLOOD RIGHT ASSIST CONTROL  Final   Special Requests BOTTLES DRAWN AEROBIC AND ANAEROBIC 7CC  Final   Culture NO GROWTH 1 DAY  Final   Report Status PENDING  Incomplete     Studies: Dg Chest Portable 1 View  08-Jan-2015   CLINICAL DATA:  Cough and dyspnea.  EXAM: PORTABLE CHEST - 1 VIEW   COMPARISON:  Sized 16 2010  FINDINGS: The lungs are hyperinflated with emphysema. Rounded calcifications projecting over the right lower lung zone are likely related to costochondral cartilage, and appears similar to prior exam. No confluent airspace disease. The heart size is normal, there is tortuosity of the thoracic aorta. Aortic stent graft is noted. No pneumothorax or large pleural effusion.  IMPRESSION: Advanced emphysema. No evidence of superimposed acute process on portable chest radiograph. Chronic calcifications projecting over the right lower lung zone are likely related to costochondral cartilage.   Electronically Signed   By: Rubye Oaks M.D.   On: 12/26/2014 01:08    Scheduled Meds: . albuterol  2.5 mg Nebulization QID  . antiseptic oral rinse  7 mL Mouth Rinse q12n4p  . aspirin EC  81 mg Oral Daily  . chlorhexidine  15 mL Mouth Rinse BID  . enoxaparin (LOVENOX) injection  30 mg Subcutaneous Q24H  . fluticasone  2 spray Each Nare Daily  . [START ON 01/04/2015] levofloxacin (LEVAQUIN) IV  750 mg Intravenous Q48H  . loratadine  10 mg Oral Daily  . methylPREDNISolone (SOLU-MEDROL) injection  60 mg Intravenous Q6H  . mometasone-formoterol  2 puff Inhalation BID  . multivitamin with minerals  1 tablet Oral Daily  . pantoprazole  40 mg Oral Daily  . QUEtiapine  25 mg Oral QHS  . sodium chloride  3 mL Intravenous Q12H  . tiotropium  18 mcg Inhalation Daily    Assessment/Plan:  1. Acute on chronic respiratory failure with hypoxia- currently on 48% high flow nasal canula in order to move air. 2. COPD exacerbation- continue Solu-Medrol 60 mg every 6 hours and continue nebulizers and inhalers. 3. Clinical sepsis- UC growing E coli.  Agree with empiric Levaquin for now. Acute cystitis with hematuria. 4. History of peripheral vascular disease. 5. Insomnia- trial of Seroquel. 6. Failure to thrive, bedbound, poor mobility and poor appetite. Family interested in hospice back at their  facility.  Code Status:     Code Status Orders        Start     Ordered   01/01/2015 0527  Do not attempt resuscitation (DNR)   Continuous    Question Answer Comment  In the event of cardiac or respiratory ARREST Do not call a "code blue"   In the event of cardiac or respiratory ARREST Do not perform Intubation, CPR, defibrillation or ACLS   In the event of cardiac or respiratory ARREST Use medication by any route, position, wound care, and other measures to relive pain and suffering. May use oxygen, suction and manual treatment of airway obstruction as needed for comfort.   Comments RN may pronounce death      12/28/2014 [phone]  Advance Directive Documentation        Most Recent Value   Type of Advance Directive  Out of facility DNR (pink MOST or yellow form)   Pre-existing out of facility DNR order (yellow form or pink MOST form)  Yellow form placed in chart (order not valid for inpatient use)   "MOST" Form in Place?       Family Communication: Family at bedside.  Disposition Plan: Back to facility with hospice  Antibiotics:  Levaquin  Time spent: 25 minutes  Alford Highland  [facility]

## 2015-01-03 NOTE — Plan of Care (Signed)
Problem: Discharge Progression Outcomes Goal: Other Discharge Outcomes/Goals Outcome: Progressing Plan of Care Progress to Goal:  Pt has poor PO intake - seen by dietician.  Ensure and Magic Cups added to meals, but pt doesn't feel like eating.  Roxinal added to help w/SOB.  Foley d/ced. Will return to [facility] followed by Hospice upon d/c.

## 2015-01-03 NOTE — Plan of Care (Signed)
Problem: Discharge Progression Outcomes Goal: Other Discharge Outcomes/Goals Outcome: Progressing Patient is disoriented to time, no c/o pain at this time. Continues on high flow nasal cannula. Trial Seroquel given in the evening, patient remains awake all night. Family remains at bedside. Bedbound, foley remains in place.

## 2015-01-03 NOTE — Progress Notes (Signed)
New referral for [facility] services at [facility] after discharge received from CSW Ilona Sorrel [place].  Ms. Rampy was admitted from [facility] on 01/02/15 for treatment of increased shortness of breath requiring Bipap and now on Hi flo at 50% FiO2 and flow rate of 45 l/min. She has a PMH of COPD, HTN, Hyperlipidemia, CKD, and chronic anemia. She is bed bound and oxygen dependent on 2 liters at baseline. She is a DNR code, signed out of facility DNR in place. Patient seen at bedside alert, appeared to be very dyspneic. After discussion with patient's daughter, staff RN Corrie Dandy was notified and contacted attending physician for PRN dose of liquid morphine. Hospice services at [facility] were explained with good understanding voiced by [facility]. She notes a steady decline in her mother over the past few months, reporting poor appetite, albumin 3.4. Of note Ms. Stith was previously followed by [facility] in her home. Hospice folder and contact information left with Shelly. CSW Maralyn Sago made aware. Will continue to follow through final disposition. Thank you. Dayna Barker RN, BSN, [facility] [place], [facility] [phone] c

## 2015-01-04 DIAGNOSIS — I10 Essential (primary) hypertension: Secondary | ICD-10-CM

## 2015-01-04 DIAGNOSIS — Z79899 Other long term (current) drug therapy: Secondary | ICD-10-CM

## 2015-01-04 DIAGNOSIS — B9620 Unspecified Escherichia coli [E. coli] as the cause of diseases classified elsewhere: Secondary | ICD-10-CM

## 2015-01-04 DIAGNOSIS — J449 Chronic obstructive pulmonary disease, unspecified: Secondary | ICD-10-CM

## 2015-01-04 DIAGNOSIS — D649 Anemia, unspecified: Secondary | ICD-10-CM

## 2015-01-04 DIAGNOSIS — Z515 Encounter for palliative care: Secondary | ICD-10-CM

## 2015-01-04 DIAGNOSIS — N390 Urinary tract infection, site not specified: Secondary | ICD-10-CM

## 2015-01-04 DIAGNOSIS — I499 Cardiac arrhythmia, unspecified: Secondary | ICD-10-CM

## 2015-01-04 DIAGNOSIS — I714 Abdominal aortic aneurysm, without rupture: Secondary | ICD-10-CM

## 2015-01-04 DIAGNOSIS — Z87891 Personal history of nicotine dependence: Secondary | ICD-10-CM

## 2015-01-04 DIAGNOSIS — R627 Adult failure to thrive: Secondary | ICD-10-CM

## 2015-01-04 DIAGNOSIS — A419 Sepsis, unspecified organism: Secondary | ICD-10-CM

## 2015-01-04 DIAGNOSIS — E785 Hyperlipidemia, unspecified: Secondary | ICD-10-CM

## 2015-01-04 DIAGNOSIS — N189 Chronic kidney disease, unspecified: Secondary | ICD-10-CM

## 2015-01-04 DIAGNOSIS — R630 Anorexia: Secondary | ICD-10-CM

## 2015-01-04 DIAGNOSIS — E43 Unspecified severe protein-calorie malnutrition: Secondary | ICD-10-CM

## 2015-01-04 DIAGNOSIS — I129 Hypertensive chronic kidney disease with stage 1 through stage 4 chronic kidney disease, or unspecified chronic kidney disease: Secondary | ICD-10-CM

## 2015-01-04 LAB — URINE CULTURE
Culture: >=100000
SPECIAL REQUESTS: NORMAL

## 2015-01-04 MED ORDER — QUETIAPINE FUMARATE 25 MG PO TABS
12.5000 mg | ORAL_TABLET | Freq: Every day | ORAL | Status: DC
  Administered 2015-01-04 – 2015-01-05 (×2): 12.5 mg via ORAL
  Filled 2015-01-04 (×2): qty 1

## 2015-01-04 MED ORDER — METHYLPREDNISOLONE SODIUM SUCC 40 MG IJ SOLR
40.0000 mg | Freq: Three times a day (TID) | INTRAMUSCULAR | Status: DC
  Administered 2015-01-04 – 2015-01-05 (×2): 40 mg via INTRAVENOUS
  Filled 2015-01-04 (×2): qty 1

## 2015-01-04 NOTE — Clinical Social Work Note (Signed)
Clinical Social Worker is continuing to follow for discharge planning needs. Pt will return to [facility] with [facility]. Potential discharge for tomorrow. CSW will continue to follow.   Dede Query, MSW, LCSW Clinical Social Worker [phone]

## 2015-01-04 NOTE — Plan of Care (Signed)
Problem: Discharge Progression Outcomes Goal: Other Discharge Outcomes/Goals Outcome: Progressing Plan of care progress: Infection: -continues IV ABT -continues IV solumedrol -PRN roxanol for SOB with improvement -transitioned from HFNC to 4L [place], resting comfortably -turned and repositioned throughout shift -no noted pain -poor appetite -plan to discharge to [facility] with hospice -family at bedside -

## 2015-01-04 NOTE — Progress Notes (Signed)
Follow up visit made. Patient seen lying in bed, eyes opened, smiles easily with interaction, appears more lethargic. Currently she is on oxygen at 4 liters via nasal cannula, transitioned from Hi flo. She  Had received a dose of PRN liquid morphine prior to visit. Daughter Burnett Harry present. She reports Ms. Bellmore did eat some grits this morning. Will continue to follow through discharge.  Dayna Barker RN, BSN, [facility] [place], [facility] liaison [phone] c

## 2015-01-04 NOTE — Progress Notes (Signed)
Patient ID: Rita Cox, female   DOB: Jun 26, 1924, 81 y.o.   MRN: [id number] [facility] PROGRESS NOTE  PCP: Tillman Abide, MD  HPI/Subjective: Patient states that she is breathing better than when she came in. She is a little cloudy and sleepy with the Roxanol. She slept well last night. Some cough.  Objective: Filed Vitals:   01/04/15 0519  BP: 115/64  Pulse: 100  Temp: 97.5 F (36.4 C)  Resp:     ROS: Review of Systems  Constitutional: Negative for fever and chills.  Eyes: Negative for blurred vision.  Respiratory: Positive for cough and shortness of breath. Negative for sputum production.   Cardiovascular: Negative for chest pain.  Gastrointestinal: Negative for nausea, vomiting, abdominal pain, diarrhea and constipation.  Genitourinary: Negative for dysuria.  Musculoskeletal: Negative for joint pain.  Neurological: Negative for dizziness and headaches.   Exam: Physical Exam  HENT:  Nose: No mucosal edema.  Mouth/Throat: No oropharyngeal exudate or posterior oropharyngeal edema.  Eyes: Conjunctivae, EOM and lids are normal. Pupils are equal, round, and reactive to light.  Neck: No JVD present. Carotid bruit is not present. No edema present. No thyroid mass and no thyromegaly present.  Cardiovascular: S1 normal and S2 normal.  Tachycardia present.  Exam reveals no gallop.   No murmur heard. Pulses:      Dorsalis pedis pulses are 1+ on the right side, and 1+ on the left side.  Respiratory: No accessory muscle usage. No respiratory distress. She has decreased breath sounds in the right lower field and the left lower field. She has wheezes in the right lower field and the left lower field. She has no rhonchi. She has no rales.  GI: Soft. Bowel sounds are normal. There is no tenderness.  Musculoskeletal:       Right ankle: She exhibits no swelling.       Left ankle: She exhibits no swelling.  Lymphadenopathy:    She has no cervical adenopathy.  Neurological:  She is alert. No cranial nerve deficit.  Skin: Skin is warm. Nails show no clubbing.  Psychiatric: She has a normal mood and affect.    Data Reviewed: Basic Metabolic Panel:  Recent Labs Lab 01/02/15 0019  NA 139  K 4.3  CL 99*  CO2 29  GLUCOSE 155*  BUN 20  CREATININE 0.99  CALCIUM 9.1   Liver Function Tests:  Recent Labs Lab 01/02/15 0019  AST 24  ALT 8*  ALKPHOS 87  BILITOT 0.6  PROT 7.4  ALBUMIN 3.4*   CBC:  Recent Labs Lab 01/02/15 0019  WBC 13.9*  NEUTROABS 11.9*  HGB 10.9*  HCT 34.2*  MCV 86.2  PLT 263    Scheduled Meds: . albuterol  2.5 mg Nebulization QID  . antiseptic oral rinse  7 mL Mouth Rinse q12n4p  . aspirin EC  81 mg Oral Daily  . chlorhexidine  15 mL Mouth Rinse BID  . enoxaparin (LOVENOX) injection  30 mg Subcutaneous Q24H  . fluticasone  2 spray Each Nare Daily  . levofloxacin (LEVAQUIN) IV  750 mg Intravenous Q48H  . loratadine  10 mg Oral Daily  . methylPREDNISolone (SOLU-MEDROL) injection  40 mg Intravenous 3 times per day  . mometasone-formoterol  2 puff Inhalation BID  . multivitamin with minerals  1 tablet Oral Daily  . pantoprazole  40 mg Oral Daily  . QUEtiapine  12.5 mg Oral QHS  . sodium chloride  3 mL Intravenous Q12H  . tiotropium  18 mcg  Inhalation Daily    Assessment/Plan:  1. Acute on chronic respiratory failure with hypoxia- switched over to nasal cannula today. She is moving better air. 2. COPD exacerbation- decrease Solu-Medrol 40 mg every 8 hours and continue nebulizers and inhalers. Patient on Roxanol when necessary. 3. Clinical sepsis- UC growing E coli sensitive to Levaquin. Acute cystitis with hematuria. 4. History of peripheral vascular disease. 5. Insomnia- decrease dose of Seroquel. 6. Failure to thrive, bedbound, poor mobility and poor appetite. Family interested in hospice back at their facility.  Code Status:     Code Status Orders        Start     Ordered   01-28-2015 0527  Do not attempt  resuscitation (DNR)   Continuous    Question Answer Comment  In the event of cardiac or respiratory ARREST Do not call a "code blue"   In the event of cardiac or respiratory ARREST Do not perform Intubation, CPR, defibrillation or ACLS   In the event of cardiac or respiratory ARREST Use medication by any route, position, wound care, and other measures to relive pain and suffering. May use oxygen, suction and manual treatment of airway obstruction as needed for comfort.   Comments RN may pronounce death      2015/01/28 1610    Advance Directive Documentation        Most Recent Value   Type of Advance Directive  Out of facility DNR (pink MOST or yellow form)   Pre-existing out of facility DNR order (yellow form or pink MOST form)  Yellow form placed in chart (order not valid for inpatient use)   "MOST" Form in Place?       Family Communication: Family at bedside.  Disposition Plan: Back to facility with hospice  Antibiotics:  Levaquin  Time spent: 20 minutes  Alford Highland  [facility] Hospitalists

## 2015-01-04 NOTE — Plan of Care (Signed)
Problem: Discharge Progression Outcomes Goal: Other Discharge Outcomes/Goals Outcome: Progressing Plan of care progress to goal No c/o pain. Pt did received Roxanol for sob with improvement. HFNC at 50%.  Pt at baseline as far as activity level  Incontinent of urine.  Son at bedside. Plan is for hospice care at discharge.

## 2015-01-04 NOTE — Care Management Important Message (Signed)
Important Message  Patient Details  Name: Rita Cox MRN: [id number] Date of Birth: [birth year]   Medicare Important Message Given:  Yes-second notification given    [name] 01/04/2015, 9:49 AM

## 2015-01-05 LAB — CBC
HCT: 30.1 % — ABNORMAL LOW (ref 35.0–47.0)
Hemoglobin: 9.7 g/dL — ABNORMAL LOW (ref 12.0–16.0)
MCH: 28.2 pg (ref 26.0–34.0)
MCHC: 32.3 g/dL (ref 32.0–36.0)
MCV: 87.2 fL (ref 80.0–100.0)
PLATELETS: 235 10*3/uL (ref 150–440)
RBC: 3.45 MIL/uL — ABNORMAL LOW (ref 3.80–5.20)
RDW: 13.8 % (ref 11.5–14.5)
WBC: 9.9 10*3/uL (ref 3.6–11.0)

## 2015-01-05 LAB — BASIC METABOLIC PANEL
ANION GAP: 5 (ref 5–15)
BUN: 44 mg/dL — ABNORMAL HIGH (ref 6–20)
CHLORIDE: 104 mmol/L (ref 101–111)
CO2: 28 mmol/L (ref 22–32)
Calcium: 9.2 mg/dL (ref 8.9–10.3)
Creatinine, Ser: 1.39 mg/dL — ABNORMAL HIGH (ref 0.44–1.00)
GFR calc Af Amer: 38 mL/min — ABNORMAL LOW (ref >60)
GFR, EST NON AFRICAN AMERICAN: 33 mL/min — AB (ref >60)
GLUCOSE: 151 mg/dL — AB (ref 65–99)
POTASSIUM: 4.7 mmol/L (ref 3.5–5.1)
Sodium: 137 mmol/L (ref 135–145)

## 2015-01-05 MED ORDER — METHYLPREDNISOLONE SODIUM SUCC 125 MG IJ SOLR
60.0000 mg | Freq: Three times a day (TID) | INTRAMUSCULAR | Status: DC
  Administered 2015-01-05 – 2015-01-06 (×3): 60 mg via INTRAVENOUS
  Filled 2015-01-05 (×3): qty 2

## 2015-01-05 MED ORDER — LEVOFLOXACIN 750 MG PO TABS
750.0000 mg | ORAL_TABLET | ORAL | Status: DC
  Administered 2015-01-06: 750 mg via ORAL
  Filled 2015-01-05: qty 1

## 2015-01-05 MED ORDER — SODIUM CHLORIDE 0.9 % IV BOLUS (SEPSIS)
250.0000 mL | Freq: Once | INTRAVENOUS | Status: AC
  Administered 2015-01-05: 250 mL via INTRAVENOUS

## 2015-01-05 MED ORDER — DILTIAZEM HCL ER COATED BEADS 120 MG PO CP24
120.0000 mg | ORAL_CAPSULE | Freq: Every day | ORAL | Status: DC
  Administered 2015-01-05 – 2015-01-07 (×3): 120 mg via ORAL
  Filled 2015-01-05 (×3): qty 1

## 2015-01-05 MED ORDER — HEPARIN SODIUM (PORCINE) 5000 UNIT/ML IJ SOLN
5000.0000 [IU] | Freq: Three times a day (TID) | INTRAMUSCULAR | Status: DC
  Administered 2015-01-05 – 2015-01-07 (×5): 5000 [IU] via SUBCUTANEOUS
  Filled 2015-01-05 (×5): qty 1

## 2015-01-05 MED ORDER — BUDESONIDE 0.25 MG/2ML IN SUSP
0.2500 mg | Freq: Two times a day (BID) | RESPIRATORY_TRACT | Status: DC
  Administered 2015-01-05 – 2015-01-07 (×4): 0.25 mg via RESPIRATORY_TRACT
  Filled 2015-01-05 (×4): qty 2

## 2015-01-05 MED ORDER — METHYLPREDNISOLONE SODIUM SUCC 40 MG IJ SOLR
40.0000 mg | Freq: Once | INTRAMUSCULAR | Status: AC
  Administered 2015-01-05: 40 mg via INTRAVENOUS
  Filled 2015-01-05: qty 1

## 2015-01-05 NOTE — Plan of Care (Signed)
Problem: Discharge Progression Outcomes Goal: Other Discharge Outcomes/Goals Outcome: Progressing Plan of care progress: Infection: -continues IV ABT -continues IV solumedrol -PRN roxanol for SOB with improvement -3L [place], resting comfortably -turned and repositioned throughout shift -no noted pain -poor appetite -plan to discharge to [facility] with hospice -family at bedside

## 2015-01-05 NOTE — Progress Notes (Signed)
PT Cancellation Note  Patient Details Name: Rita Cox MRN: [id number] DOB: [birth year]   Cancelled Treatment:    Reason Eval/Treat Not Completed:  (See PT note for further details) PT checked with pt and family regarding the desire to pursue PT in some capacity if needed, however family respectfully declined any wish for therapy. Therefore pt will no longer be seen by PT. If new need arises, please submit new orders.    Benna Dunks 01/05/2015, 1:55 PM  Benna Dunks, SPT. [phone]

## 2015-01-05 NOTE — Progress Notes (Signed)
   01/05/15 1500  Clinical Encounter Type  Visited With Patient and family together  Visit Type Initial  Provided pastoral support and presence to patient and her family member in patient's room.  [employer] Manette Doto-pager [phone]

## 2015-01-05 NOTE — Plan of Care (Signed)
Problem: Discharge Progression Outcomes Goal: Other Discharge Outcomes/Goals Outcome: Progressing Plan of care progress to goal: 1. No c/o pain.  2. VSS. 02 at 4 liters. Pt wears 02 chronically.  3. Ate more for breakfast and lunch than she did at dinner. 4. Pt at baseline as far as activity.  Plan: Pt for possible discharge to [facility] with hospice care.

## 2015-01-05 NOTE — Progress Notes (Signed)
Patient ID: Rita Cox, female   DOB: April 12, 1925, 81 y.o.   MRN: [id number] [facility] Physicians PROGRESS NOTE  PCP: Tillman Abide, MD  HPI/Subjective: Patient feels that her breathing is worse today than yesterday. Complains of shortness of breath. Occasional cough. She slept well last night. Needed to be straight catheter for urinary retention.   Objective: Filed Vitals:   01/05/15 0505  BP: 111/55  Pulse: 124  Temp: 97.5 F (36.4 C)  Resp: 18    ROS: Review of Systems  Constitutional: Negative for fever and chills.  Eyes: Negative for blurred vision.  Respiratory: Positive for cough and shortness of breath. Negative for sputum production.   Cardiovascular: Negative for chest pain.  Gastrointestinal: Negative for nausea, vomiting, abdominal pain, diarrhea and constipation.  Genitourinary: Negative for dysuria.  Musculoskeletal: Negative for joint pain.  Neurological: Negative for dizziness and headaches.   Exam: Physical Exam  HENT:  Nose: No mucosal edema.  Mouth/Throat: No oropharyngeal exudate or posterior oropharyngeal edema.  Eyes: Conjunctivae, EOM and lids are normal. Pupils are equal, round, and reactive to light.  Neck: No JVD present. Carotid bruit is not present. No edema present. No thyroid mass and no thyromegaly present.  Cardiovascular: S1 normal and S2 normal.  Tachycardia present.  Exam reveals no gallop.   No murmur heard. Pulses:      Dorsalis pedis pulses are 1+ on the right side, and 1+ on the left side.  Respiratory: Accessory muscle usage present. No respiratory distress. She has decreased breath sounds in the right middle field, the right lower field, the left middle field and the left lower field. She has wheezes in the right lower field and the left lower field. She has no rhonchi. She has no rales.  GI: Soft. Bowel sounds are normal. There is no tenderness.  Musculoskeletal:       Right ankle: She exhibits no swelling.       Left ankle:  She exhibits no swelling.  Lymphadenopathy:    She has no cervical adenopathy.  Neurological: She is alert. No cranial nerve deficit.  Skin: Skin is warm. Nails show no clubbing.  Heel pads on for protection. Right heel has a little erythema.  Psychiatric: She has a normal mood and affect.    Data Reviewed: Basic Metabolic Panel:  Recent Labs Lab 01/02/15 0019 01/05/15 0414  NA 139 137  K 4.3 4.7  CL 99* 104  CO2 29 28  GLUCOSE 155* 151*  BUN 20 44*  CREATININE 0.99 1.39*  CALCIUM 9.1 9.2   Liver Function Tests:  Recent Labs Lab 01/02/15 0019  AST 24  ALT 8*  ALKPHOS 87  BILITOT 0.6  PROT 7.4  ALBUMIN 3.4*   CBC:  Recent Labs Lab 01/02/15 0019 01/05/15 0414  WBC 13.9* 9.9  NEUTROABS 11.9*  --   HGB 10.9* 9.7*  HCT 34.2* 30.1*  MCV 86.2 87.2  PLT 263 235    Scheduled Meds: . albuterol  2.5 mg Nebulization QID  . antiseptic oral rinse  7 mL Mouth Rinse q12n4p  . aspirin EC  81 mg Oral Daily  . budesonide (PULMICORT) nebulizer solution  0.25 mg Nebulization BID  . chlorhexidine  15 mL Mouth Rinse BID  . diltiazem  120 mg Oral Daily  . enoxaparin (LOVENOX) injection  30 mg Subcutaneous Q24H  . fluticasone  2 spray Each Nare Daily  . levofloxacin (LEVAQUIN) IV  750 mg Intravenous Q48H  . loratadine  10 mg Oral Daily  .  methylPREDNISolone (SOLU-MEDROL) injection  60 mg Intravenous 3 times per day  . methylPREDNISolone (SOLU-MEDROL) injection  40 mg Intravenous Once  . multivitamin with minerals  1 tablet Oral Daily  . pantoprazole  40 mg Oral Daily  . QUEtiapine  12.5 mg Oral QHS  . sodium chloride  250 mL Intravenous Once  . sodium chloride  3 mL Intravenous Q12H  . tiotropium  18 mcg Inhalation Daily    Assessment/Plan:  1. Acute on chronic respiratory failure with hypoxia- patient on nasal cannula 5 L. Patient using accessory muscles to breathe. 2. COPD exacerbation- increase Solu-Medrol 60 mg every 8 hours and continue nebulizers and inhalers.  Patient on Roxanol when necessary. Add budesonide nebulizer. 3. Clinical sepsis- UC growing E coli sensitive to Levaquin. Acute cystitis with hematuria. 4. History of peripheral vascular disease. 5. Insomnia- sleeping well on Seroquel. 6. Failure to thrive, bedbound, poor mobility and poor appetite. Family interested in hospice back at their facility. 7. Urinary retention- as needed straight Catheter 8. Tachycardia- start low dose Cardizem CD.  Code Status:     Code Status Orders        Start     Ordered   2015/01/31 0527  Do not attempt resuscitation (DNR)   Continuous    Question Answer Comment  In the event of cardiac or respiratory ARREST Do not call a "code blue"   In the event of cardiac or respiratory ARREST Do not perform Intubation, CPR, defibrillation or ACLS   In the event of cardiac or respiratory ARREST Use medication by any route, position, wound care, and other measures to relive pain and suffering. May use oxygen, suction and manual treatment of airway obstruction as needed for comfort.   Comments RN may pronounce death      January 31, 2015 1610    Advance Directive Documentation        Most Recent Value   Type of Advance Directive  Out of facility DNR (pink MOST or yellow form)   Pre-existing out of facility DNR order (yellow form or pink MOST form)  Yellow form placed in chart (order not valid for inpatient use)   "MOST" Form in Place?       Family Communication: Family at bedside.  Disposition Plan: Back to facility with hospice when breathing better.  Antibiotics:  Levaquin  Time spent: 20 minutes  Alford Highland  [facility] Hospitalists

## 2015-01-05 NOTE — Consult Note (Signed)
Palliative Medicine Inpatient Consult Note   Name: Rita Cox Date: 01/04/2015 MRN: [id number]  DOB: Oct 27, 1924  Referring Physician: Alford Highland, MD   I was called and asked to perform a face to face visit to enable patient to be admitted to [facility] at [facility] once she goes there.  This i, therefore,  a one time consult for that purpose.   She is an 81 yr old chronically ill and recently declining woman who has previously had Hospice in the home.  Family consists of a son and a daughter and they are both bedside during my visit.    She has advanced / end stage COPD and is oxygen dependent.  She has been on high flow oxygen but that is being changed (now) to nasal cannula.  Family states that the liquid morphine she got one time yesterday really seemed to help her feel and appear comfortable, and they would like this continued and used regularly if possible since it makes such a difference.  She has also had sepsis and an ecoli UTI while here.  She has failure to thrive, HTN, CKD, and chronic anemia. She has severe malnutrition and does not eat well due to poor appetite.  She is appropriate for Hospice Services as her COPD has affected her systemically with a poor appetite and that has resulted in a deficient immune system that cannot fight against infections when they occur.  She is also of advanced age, but is older than her chronalogic age due to chronic illness.    REVIEW OF SYSTEMS:  Patient is not able to provide ROS due to illness --on oxygen and weak and nonverbal at this visit  SPIRITUAL SUPPORT SYSTEM: Yes --adult children are present.  SOCIAL HISTORY:  reports that she quit smoking about 16 years ago. She has never used smokeless tobacco. She reports that she does not drink alcohol or use illicit drugs.  LEGAL DOCUMENTS:  DNR form  CODE STATUS: DNR  PAST MEDICAL HISTORY: Past Medical History  Diagnosis Date  . COPD (chronic obstructive pulmonary disease)    . Hypertension   . Hyperlipidemia   . Impaired fasting glucose   . AAA (abdominal aortic aneurysm)   . Chronic renal insufficiency   . Anemia   . Sleep disturbance   . Dysthymia   . Macular degeneration     PAST SURGICAL HISTORY:  Past Surgical History  Procedure Laterality Date  . Abdominal aortic aneurysm repair w/ endoluminal graft      ALLERGIES:  has No Known Allergies.  MEDICATIONS:  Current Facility-Administered Medications  Medication Dose Route Frequency Provider Last Rate Last Dose  . acetaminophen (TYLENOL) tablet 500 mg  500 mg Oral Q6H PRN Crissie Figures, MD      . albuterol (PROVENTIL) (2.5 MG/3ML) 0.083% nebulizer solution 2.5 mg  2.5 mg Nebulization QID Alford Highland, MD   2.5 mg at 01/05/15 0748  . antiseptic oral rinse (CPC / CETYLPYRIDINIUM CHLORIDE 0.05%) solution 7 mL  7 mL Mouth Rinse q12n4p Alford Highland, MD   7 mL at 01/04/15 1640  . aspirin EC tablet 81 mg  81 mg Oral Daily Crissie Figures, MD   81 mg at 01/04/15 4098  . chlorhexidine (PERIDEX) 0.12 % solution 15 mL  15 mL Mouth Rinse BID Alford Highland, MD   15 mL at 01/04/15 1948  . enoxaparin (LOVENOX) injection 30 mg  30 mg Subcutaneous Q24H Crissie Figures, MD   30 mg at 01/04/15 0824  .  fluticasone (FLONASE) 50 MCG/ACT nasal spray 2 spray  2 spray Each Nare Daily Crissie Figures, MD   2 spray at 01/04/15 [phone]  . ipratropium-albuterol (DUONEB) 0.5-2.5 (3) MG/3ML nebulizer solution 3 mL  3 mL Nebulization Q4H PRN Crissie Figures, MD      . levofloxacin (LEVAQUIN) IVPB 750 mg  750 mg Intravenous Q48H Crissie Figures, MD   750 mg at 01/04/15 1639  . loratadine (CLARITIN) tablet 10 mg  10 mg Oral Daily Crissie Figures, MD   10 mg at 01/04/15 9604  . methylPREDNISolone sodium succinate (SOLU-MEDROL) 40 mg/mL injection 40 mg  40 mg Intravenous 3 times per day Alford Highland, MD   40 mg at 01/05/15 0556  . mometasone-formoterol (DULERA) 100-5 MCG/ACT inhaler 2 puff  2 puff Inhalation BID  Crissie Figures, MD   2 puff at 01/04/15 1945  . morphine CONCENTRATE 10 MG/0.5ML oral solution 5-10 mg  5-10 mg Oral Q2H PRN Alford Highland, MD   10 mg at 01/04/15 0824  . multivitamin with minerals tablet 1 tablet  1 tablet Oral Daily Crissie Figures, MD   1 tablet at 01/04/15 [phone]  . ondansetron (ZOFRAN) tablet 4 mg  4 mg Oral Q6H PRN Crissie Figures, MD       Or  . ondansetron [facility]) injection 4 mg  4 mg Intravenous Q6H PRN Crissie Figures, MD      . pantoprazole (PROTONIX) EC tablet 40 mg  40 mg Oral Daily Crissie Figures, MD   40 mg at 01/04/15 0824  . polyethylene glycol (MIRALAX / GLYCOLAX) packet 17 g  17 g Oral Daily PRN Crissie Figures, MD      . QUEtiapine (SEROQUEL) tablet 12.5 mg  12.5 mg Oral QHS Alford Highland, MD   12.5 mg at 01/04/15 2142  . sodium chloride 0.9 % injection 3 mL  3 mL Intravenous Q12H Crissie Figures, MD   3 mL at 01/04/15 2146  . tiotropium (SPIRIVA) inhalation capsule 18 mcg  18 mcg Inhalation Daily Crissie Figures, MD   18 mcg at 01/04/15 0824    Vital Signs: BP 111/55 mmHg  Pulse 124  Temp(Src) 97.5 F (36.4 C) (Oral)  Resp 18  Ht  (1.6 m)  Wt 50.894 kg (112 lb 3.2 oz)  BMI 19.88 kg/m2  SpO2 88% Filed Weights   01/03/15 0507 01/04/15 0716 01/05/15 0500  Weight: 47.945 kg (105 lb 11.2 oz) 49.306 kg (108 lb 11.2 oz) 50.894 kg (112 lb 3.2 oz)    Estimated body mass index is 19.88 kg/(m^2) as calculated from the following:   Height as of this encounter:  (1.6 m).   Weight as of this encounter: 50.894 kg (112 lb 3.2 oz).  PERFORMANCE STATUS (ECOG) : 3 - Symptomatic, >50% confined to bed  PHYSICAL EXAM: Lying on her side --appears to have some mild respiratory distress with some use of accessory muscles to breath  EOMI Nonverbal No JVD or TM Heart rrr no mgr Lungs with poor air movement but no wheezing Abd soft and NT Ext --show muscle atrophy Skin is warm and dry  LABS: CBC:    Component Value Date/Time   WBC 9.9  01/05/2015 0414   WBC 19.7* 05/04/2012 0443   HGB 9.7* 01/05/2015 0414   HGB 11.0* 05/04/2012 0443   HCT 30.1* 01/05/2015 0414   HCT 34.0* 05/04/2012 0443   PLT 235 01/05/2015 0414   PLT 284 05/04/2012 0443  MCV 87.2 01/05/2015 0414   MCV 87 05/04/2012 0443   NEUTROABS 11.9* 01/02/2015 0019   NEUTROABS 18.4* 05/04/2012 0443   LYMPHSABS 1.3 01/02/2015 0019   LYMPHSABS 1.0 05/04/2012 0443   MONOABS 0.6 01/02/2015 0019   MONOABS 0.3 05/04/2012 0443   EOSABS 0.0 01/02/2015 0019   EOSABS 0.0 05/04/2012 0443   BASOSABS 0.1 01/02/2015 0019   BASOSABS 0.0 05/04/2012 0443   Comprehensive Metabolic Panel:    Component Value Date/Time   NA 137 01/05/2015 0414   NA 141 05/04/2012 0443   K 4.7 01/05/2015 0414   K 4.3 05/04/2012 0443   CL 104 01/05/2015 0414   CL 108* 05/04/2012 0443   CO2 28 01/05/2015 0414   CO2 29 05/04/2012 0443   BUN 44* 01/05/2015 0414   BUN 24* 05/04/2012 0443   CREATININE 1.39* 01/05/2015 0414   CREATININE 0.94 05/04/2012 0443   GLUCOSE 151* 01/05/2015 0414   GLUCOSE 165* 05/04/2012 0443   CALCIUM 9.2 01/05/2015 0414   CALCIUM 9.1 05/04/2012 0443   AST 24 01/02/2015 0019   ALT 8* 01/02/2015 0019   ALKPHOS 87 01/02/2015 0019   BILITOT 0.6 01/02/2015 0019   PROT 7.4 01/02/2015 0019   ALBUMIN 3.4* 01/02/2015 0019    IMPRESSION: 1)  Advanced / End Stage COPD 2) Sepsis and ecoli UTI 3) 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  She does need to be on a level of oxygen that can be supplied at the facility so that will be assessed.    I completed the 'Face to Face' Form and faxed it to the Hospice Office.  I also spoke with the Hospice Liason.  I talked with the family about expectations and the usefulness of low dose liquid morphine in her case, since she  does seem to respond well to it when she has air hunger.   REFERRALS TO BE ORDERED:  Hospice in the facility   More than 50% of the visit was spent in counseling/coordination of care: Yes  Time Spent: 50 minutes

## 2015-01-05 NOTE — Progress Notes (Signed)
Follow up visit made to new referral for [facility] services at [facility] after discharge. Patient seen at bedside, appears sleepy, ate bites of breakfast this morning. Respirations evan unlabored, oxygen on via nasal cannula at 3 liter. Oxygen is currently not humidified, requested staff RN Marchelle Folks made aware and agrees to add. Daughter Burnett Harry and son Harvie Heck present for visit. Per discussion with family and attending physician Dr. Renae Gloss no discharge planned for today. Patient continues on IV steroids, antibiotic changed to po Levaquin. Will continue to follow through discharge.  Dayna Barker RN, BSN, [facility] [place], [facility] [phone]

## 2015-01-05 NOTE — Progress Notes (Signed)
Pt unable to void on her own. MD notified. Order given to In and out cath x one. Will continue to monitor.Geri Seminole, RN

## 2015-01-05 NOTE — Progress Notes (Signed)
PT Cancellation Note  Patient Details Name: Rita Cox MRN: [id number] DOB: [birth year]   Cancelled Treatment:    Reason Eval/Treat Not Completed:  (See PT note for further details) Per chart review, pt is tachycardic with decreased O2 levels. PT checked with pt and family regarding the desire to pursue PT in some capacity if needed, however family respectfully declined any wish for therapy. Pt's plan is to be discharged to [facility] with hospice. Therefore pt will no longer be seen by PT. If new need arises, please submit new orders.     Benna Dunks 01/05/2015, 2:02 PM  Benna Dunks, SPT. [phone]

## 2015-01-06 LAB — CREATININE, SERUM
CREATININE: 1.5 mg/dL — AB (ref 0.44–1.00)
GFR calc Af Amer: 35 mL/min — ABNORMAL LOW (ref >60)
GFR, EST NON AFRICAN AMERICAN: 30 mL/min — AB (ref >60)

## 2015-01-06 MED ORDER — DILTIAZEM HCL ER COATED BEADS 120 MG PO CP24: 120.0000 mg | ORAL_CAPSULE | Freq: Every day | ORAL | Status: AC

## 2015-01-06 MED ORDER — PREDNISONE 20 MG PO TABS: 20.0000 mg | ORAL_TABLET | Freq: Every day | ORAL | Status: AC

## 2015-01-06 MED ORDER — BUDESONIDE 0.25 MG/2ML IN SUSP: 0.2500 mg | Freq: Two times a day (BID) | RESPIRATORY_TRACT | Status: AC

## 2015-01-06 MED ORDER — POLYETHYLENE GLYCOL 3350 17 G PO PACK
17.0000 g | PACK | Freq: Every day | ORAL | Status: DC
  Administered 2015-01-06 – 2015-01-07 (×2): 17 g via ORAL
  Filled 2015-01-06 (×2): qty 1

## 2015-01-06 MED ORDER — LEVOFLOXACIN 750 MG PO TABS: 750.0000 mg | ORAL_TABLET | ORAL | Status: AC

## 2015-01-06 MED ORDER — METHYLPREDNISOLONE SODIUM SUCC 40 MG IJ SOLR
40.0000 mg | Freq: Three times a day (TID) | INTRAMUSCULAR | Status: DC
  Administered 2015-01-06 – 2015-01-07 (×4): 40 mg via INTRAVENOUS
  Filled 2015-01-06 (×4): qty 1

## 2015-01-06 MED ORDER — MORPHINE SULFATE (CONCENTRATE) 10 MG/0.5ML PO SOLN: 5.0000 mg | ORAL | Status: AC | PRN

## 2015-01-06 MED ORDER — SODIUM CHLORIDE 0.9 % IV SOLN
INTRAVENOUS | Status: DC
  Administered 2015-01-06: 08:00:00 via INTRAVENOUS

## 2015-01-06 NOTE — Progress Notes (Signed)
Nutrition Follow-up  INTERVENTION:   Meals and Snacks: Cater to patient preferences; will recommend Dysphagia III if pt having difficulty chewing. Continue encouragement at meal times.  Medical Food Supplement Therapy: Continue as ordered Coordination of Care: pt may benefit from stronger bowel regimen secondary to constipation   NUTRITION DIAGNOSIS:   Inadequate oral intake related to acute illness, poor appetite as evidenced by meal completion < 25%, per patient/family report.  GOAL:   Patient will meet greater than or equal to 90% of their needs; ongoing  MONITOR:    (Energy Intake, Anthropometrics, Skin, Electrolyte and renal Profile, Digestive system)  ASSESSMENT:   Pt possibly discharge tomorrow per MD note. Pt confused on visit today c/o hip pain.   Diet Order:  Diet Heart Room service appropriate?: Yes; Fluid consistency:: Thin    Current Nutrition: Pt eating very little today per RN Lance Bosch. Only had applesauce with pills this am. Recorded po intake 59% of meals.    Gastrointestinal Profile: Last BM: no BM since admission (7/24)   Medications: solumedrol, MVI, Miralax given this afternoon, NS at 44mL/hr  Electrolyte/Renal Profile and Glucose Profile:   Recent Labs Lab 01/02/15 0019 01/05/15 0414 01/06/15 0505  NA 139 137  --   K 4.3 4.7  --   CL 99* 104  --   CO2 29 28  --   BUN 20 44*  --   CREATININE 0.99 1.39* 1.50*  CALCIUM 9.1 9.2  --   GLUCOSE 155* 151*  --    Protein Profile:  Recent Labs Lab 01/02/15 0019  ALBUMIN 3.4*     Weight Trend since Admission: Filed Weights   01/04/15 0716 01/05/15 0500 01/06/15 0500  Weight: 108 lb 11.2 oz (49.306 kg) 112 lb 3.2 oz (50.894 kg) 113 lb 6.4 oz (51.438 kg)   Ideal Body Weight:  52.3 kg  BMI:  Body mass index is 20.09 kg/(m^2).  Estimated Nutritional Needs:   Kcal:  1217-1438kcals, BEE: 922kcals, TEE: (IF 1.1-1.3)(AF 1.2) using IBW of 52.3kg  Protein:  52-63g protein (1.0-1.2g/kg) using IBW  of 52.3kg  Fluid:  1308-1539mL of fluid (25-59mL/kg) using IBW of 52.3kg  EDUCATION NEEDS:   Education needs no appropriate at this time  HIGH Care Level  Leda Quail, RD, LDN Pager [phone]

## 2015-01-06 NOTE — Discharge Instructions (Addendum)
Acute Respiratory Failure °Respiratory failure is when your lungs are not working well and your breathing (respiratory) system fails. When respiratory failure occurs, it is difficult for your lungs to get enough oxygen, get rid of carbon dioxide, or both. Respiratory failure can be life threatening.  °Respiratory failure can be acute or chronic. Acute respiratory failure is sudden, severe, and requires emergency medical treatment. Chronic respiratory failure is less severe, happens over time, and requires ongoing treatment.  °WHAT ARE THE CAUSES OF ACUTE RESPIRATORY FAILURE?  °Any problem affecting the heart or lungs can cause acute respiratory failure. Some of these causes include the following: °· Chronic bronchitis and emphysema (COPD).   °· Blood clot going to a lung (pulmonary embolism).   °· Having water in the lungs caused by heart failure, lung injury, or infection (pulmonary edema).   °· Collapsed lung (pneumothorax).   °· Pneumonia.   °· Pulmonary fibrosis.   °· Obesity.   °· Asthma.   °· Heart failure.   °· Any type of trauma to the chest that can make breathing difficult.   °· Nerve or muscle diseases making chest movements difficult. °WHAT SYMPTOMS SHOULD YOU WATCH FOR?  °If you have any of these signs or symptoms, you should seek immediate medical care:  °· You have shortness of breath (dyspnea) with or without activity.   °· You have rapid, fast breathing (tachypnea).   °· You are wheezing. °· You are unable to say more than a few words without having to catch your breath. °· You find it very difficult to function normally. °· You have a fast heart rate.   °· You have a bluish color to your finger or toe nail beds.   °· You have confusion or drowsiness or both.   °HOW WILL MY ACUTE RESPIRATORY FAILURE BE TREATED?  °Treatment of acute respiratory failure depends on the cause of the respiratory failure. Usually, you will stay in the intensive care unit so your breathing can be watched closely. Treatment  can include the following: °· Oxygen. Oxygen can be delivered through the following: °¨ Nasal cannula. This is small tubing that goes in your nose to give you oxygen. °¨ Face mask. A face mask covers your nose and mouth to give you oxygen. °· Medicine. Different medicines can be given to help with breathing. These can include: °¨ Nebulizers. Nebulizers deliver medicines to open the air passages (bronchodilators). These medicines help to open or relax the airways in the lungs so you can breathe better. They can also help loosen mucus from your lungs. °¨ Diuretics. Diuretic medicines can help you breathe better by getting rid of extra water in your body. °¨ Steroids. Steroid medicines can help decrease swelling (inflammation) in your lungs. °¨ Antibiotics. °· Chest tube. If you have a collapsed lung (pneumothorax), a chest tube is placed to help reinflate the lung. °· Non-invasive positive pressure ventilation (NPPV). This is a tight-fitting mask that goes over your nose and mouth. The mask has tubing that is attached to a machine. The machine blows air into the tubing, which helps to keep the tiny air sacs (alveoli) in your lungs open. This machine allows you to breathe on your own. °· Ventilator. A ventilator is a breathing machine. When on a ventilator, a breathing tube is put into the lungs. A ventilator is used when you can no longer breathe well enough on your own. You may have low oxygen levels or high carbon dioxide (CO2) levels in your blood. When you are on a ventilator, sedation and pain medicines are given to make you sleep   so your lungs can heal. Document Released: 06/01/2013 Document Revised: 10/11/2013 Document Reviewed: 06/01/2013 [facility] Patient Information 2015 [place], [place]. This information is not intended to replace advice given to you by your health care provider. Make sure you discuss any questions you have with your health care provider.    DIET:  Low fat, Low cholesterol  diet  DISCHARGE CONDITION:  Stable  ACTIVITY:  Activity as tolerated  OXYGEN:  Home Oxygen: Yes.     Oxygen Delivery: 3 liters/min via Patient connected to nasal cannula oxygen  DISCHARGE LOCATION:  nursing home    ADDITIONAL DISCHARGE INSTRUCTION:In and out cath as needed   Hospices to follow patient at SNF   If you experience worsening of your admission symptoms, develop shortness of breath, life threatening emergency, suicidal or homicidal thoughts you must seek medical attention immediately by calling 911 or calling your MD immediately  if symptoms less severe.  You Must read complete instructions/literature along with all the possible adverse reactions/side effects for all the Medicines you take and that have been prescribed to you. Take any new Medicines after you have completely understood and accpet all the possible adverse reactions/side effects.   Please note  You were cared for by a hospitalist during your hospital stay. If you have any questions about your discharge medications or the care you received while you were in the hospital after you are discharged, you can call the unit and asked to speak with the hospitalist on call if the hospitalist that took care of you is not available. Once you are discharged, your primary care physician will handle any further medical issues. Please note that NO REFILLS for any discharge medications will be authorized once you are discharged, as it is imperative that you return to your primary care physician (or establish a relationship with a primary care physician if you do not have one) for your aftercare needs so that they can reassess your need for medications and monitor your lab values.

## 2015-01-06 NOTE — Plan of Care (Signed)
Problem: Discharge Progression Outcomes Goal: Other Discharge Outcomes/Goals Outcome: Progressing Plan of care progress to goal: Pain - no pain this shift Diet - poor Activity - bedbound

## 2015-01-06 NOTE — Progress Notes (Signed)
ANTIBIOTIC CONSULT NOTE - INITIAL  Pharmacy Consult for [facility] Indication: Sepsis/UTI/COPD exacerbation  No Known Allergies  Patient Measurements: Height: 5\' 3"  (160 cm) Weight: 113 lb 6.4 oz (51.438 kg) IBW/kg (Calculated) : 52.4 Adjusted Body Weight:   Vital Signs: Temp: 97.6 F (36.4 C) (07/29 1224) Temp Source: Oral (07/29 1224) BP: 108/50 mmHg (07/29 1224) Pulse Rate: 93 (07/29 1224) Intake/Output from previous day: 07/28 0701 - 07/29 0700 In: 600 [P.O.:600] Out: 350 [Urine:350] Intake/Output from this shift: Total I/O In: 360 [P.O.:360] Out: -   Labs:  Recent Labs  01/05/15 0414 01/06/15 0505  WBC 9.9  --   HGB 9.7*  --   PLT 235  --   CREATININE 1.39* 1.50*   Estimated Creatinine Clearance: 21 mL/min (by C-G formula based on Cr of 1.5).   Microbiology: Recent Results (from the past 720 hour(s))  Blood culture (routine x 2)     Status: None (Preliminary result)   Collection Time: 01/02/15 12:19 AM  Result Value Ref Range Status   Specimen Description BLOOD RIGHT ASSIST CONTROL  Final   Special Requests BOTTLES DRAWN AEROBIC AND ANAEROBIC 7CC  Final   Culture NO GROWTH 4 DAYS  Final   Report Status PENDING  Incomplete  Urine culture     Status: None   Collection Time: 01/02/15 12:43 AM  Result Value Ref Range Status   Specimen Description URINE, CLEAN CATCH  Final   Special Requests Normal  Final   Culture >=100,000 COLONIES/mL ESCHERICHIA COLI  Final   Report Status 01/04/2015 FINAL  Final   Organism ID, Bacteria ESCHERICHIA COLI  Final      Susceptibility   Escherichia coli - MIC*    AMPICILLIN >=32 RESISTANT Resistant     CEFAZOLIN <=4 SENSITIVE Sensitive     CEFTRIAXONE <=1 SENSITIVE Sensitive     CIPROFLOXACIN <=0.25 SENSITIVE Sensitive     GENTAMICIN <=1 SENSITIVE Sensitive     IMIPENEM <=0.25 SENSITIVE Sensitive     NITROFURANTOIN <=16 SENSITIVE Sensitive     TRIMETH/SULFA >=320 RESISTANT Resistant     PIP/TAZO Value in next row  Sensitive      SENSITIVE<=4    * >=100,000 COLONIES/mL ESCHERICHIA COLI  Blood culture (routine x 2)     Status: None (Preliminary result)   Collection Time: 01/02/15  1:00 AM  Result Value Ref Range Status   Specimen Description BLOOD RIGHT ASSIST CONTROL  Final   Special Requests BOTTLES DRAWN AEROBIC AND ANAEROBIC 7CC  Final   Culture NO GROWTH 4 DAYS  Final   Report Status PENDING  Incomplete    Medical History: Past Medical History  Diagnosis Date  . COPD (chronic obstructive pulmonary disease)   . Hypertension   . Hyperlipidemia   . Impaired fasting glucose   . AAA (abdominal aortic aneurysm)   . Chronic renal insufficiency   . Anemia   . Sleep disturbance   . Dysthymia   . Macular degeneration     Medications:  Scheduled:  . albuterol  2.5 mg Nebulization QID  . antiseptic oral rinse  7 mL Mouth Rinse q12n4p  . aspirin EC  81 mg Oral Daily  . budesonide (PULMICORT) nebulizer solution  0.25 mg Nebulization BID  . diltiazem  120 mg Oral Daily  . fluticasone  2 spray Each Nare Daily  . heparin subcutaneous  5,000 Units Subcutaneous 3 times per day  . levofloxacin  750 mg Oral Q48H  . loratadine  10 mg Oral Daily  .  methylPREDNISolone (SOLU-MEDROL) injection  40 mg Intravenous 3 times per day  . multivitamin with minerals  1 tablet Oral Daily  . pantoprazole  40 mg Oral Daily  . polyethylene glycol  17 g Oral Daily  . sodium chloride  3 mL Intravenous Q12H  . tiotropium  18 mcg Inhalation Daily   Anti-infectives    Start     Dose/Rate Route Frequency Ordered Stop   01/06/15 1800  levofloxacin (LEVAQUIN) tablet 750 mg     750 mg Oral Every 48 hours 01/05/15 0906     01/06/15 0000  levofloxacin (LEVAQUIN) 750 MG tablet     750 mg Oral Every 48 hours 01/06/15 1343     01/04/15 1800  levofloxacin (LEVAQUIN) IVPB 750 mg  Status:  Discontinued     750 mg 100 mL/hr over 90 Minutes Intravenous Every 48 hours 01/02/15 0540 01/05/15 0906   01/02/15 0115  levofloxacin  (LEVAQUIN) IVPB 750 mg     750 mg 100 mL/hr over 90 Minutes Intravenous  Once 01/02/15 0110 01/02/15 0249     Assessment: 81 yo F with sepsis/UTI. Hx COPD   Plan:  Follow up culture results  7/25: Ucx: E.coli, (resistant to ampicillin/septra, sens. To cipro.  7/25: Bcx NGTD  Will continue with Levaquin  po Q48h.   Bari Mantis PharmD Clinical Pharmacist 01/06/2015

## 2015-01-06 NOTE — Care Management Important Message (Signed)
Important Message  Patient Details  Name: Rita Cox MRN: [id number] Date of Birth: September 05, 1924   Medicare Important Message Given:  Yes-third notification given    Gwenette Greet 01/06/2015, 8:19 AM

## 2015-01-06 NOTE — Progress Notes (Signed)
CSW informed by RN care manager that patient will discharge back to [facility] on Sat.  Per MD note patient to discharge when breathing better. DC summary has been completed by MD.    Copy of Rx's faxed to [facility] with DC summary. Hard copy of Rx on chart. Call to Dough at [facility] to inform him that patient will discharge back to facility tomorrow.  He will call back with room and report.    Sammuel Hines. Theresia Majors, MSW Clinical Social Work Department Emergency Room [phone] 2:50 PM

## 2015-01-06 NOTE — Plan of Care (Signed)
Problem: Discharge Progression Outcomes Goal: Tolerating diet Outcome: Not Progressing Poor appetite - encouragement given.  Goal: Other Discharge Outcomes/Goals Plan of care progress to goal:  No complaints of pain this shift. Patient remains on 3L-O2. Repositioned and turned every 2 hours. Family at bedside.

## 2015-01-06 NOTE — Progress Notes (Signed)
Follow up visit made on new referral for [facility] services at [facility] after discharge. Patient seen lying in bed appears pale and lethargic, respirations even, unlabored, no s/s of dyspnea, oxygen on at 3 liters via nasal cannula. Last dose of liquid morphine given at 4:30 on 7/28. Patient's son Harvie Heck at bedside. Writer discussed the changes/decline noted in the last 2 days, he also has noted this. He continues to be in agreement with hospice services at [facility], he recognizes that this appears to be for end of life. Writer spoke with attending physician Dr. Renae Gloss, no discharge planned for today, patient is currently receiving IVF based on lab results from this morning. She continues with bites and sips, some restlessness at night. Writer confirmed with Dr. Renae Gloss that Ms. Gadbois would discharge with a prescription for liquid morphine, Harvie Heck made aware and appreciative. Thank you. Dayna Barker RN, BSN, [facility] [place], [facility] [phone] c

## 2015-01-06 NOTE — Progress Notes (Signed)
Patient ID: Rita Cox, female   DOB: 15-Aug-1924, 81 y.o.   MRN: [id number] [facility] PROGRESS NOTE  PCP: Tillman Abide, MD  HPI/Subjective: Patient cloudy this morning. States she she is eating okay but as per the nursing staff not eating much. Still short of breath. Some cough. No bowel movements since she's been here.  Objective: Filed Vitals:   01/06/15 1224  BP: 108/50  Pulse: 93  Temp: 97.6 F (36.4 C)  Resp:     ROS: Review of Systems  Constitutional: Negative for fever and chills.  Eyes: Negative for blurred vision.  Respiratory: Positive for cough and shortness of breath. Negative for sputum production.   Cardiovascular: Negative for chest pain.  Gastrointestinal: Positive for constipation. Negative for nausea, vomiting, abdominal pain and diarrhea.  Genitourinary: Negative for dysuria.  Musculoskeletal: Negative for joint pain.  Neurological: Negative for dizziness and headaches.   Exam: Physical Exam  HENT:  Nose: No mucosal edema.  Mouth/Throat: No oropharyngeal exudate or posterior oropharyngeal edema.  Eyes: Conjunctivae, EOM and lids are normal. Pupils are equal, round, and reactive to light.  Neck: No JVD present. Carotid bruit is not present. No edema present. No thyroid mass and no thyromegaly present.  Cardiovascular: S1 normal and S2 normal.  Exam reveals no gallop.   No murmur heard. Pulses:      Dorsalis pedis pulses are 1+ on the right side, and 1+ on the left side.  Respiratory: No accessory muscle usage. No respiratory distress. She has decreased breath sounds in the right middle field, the right lower field, the left middle field and the left lower field. She has wheezes in the right lower field and the left lower field. She has no rhonchi. She has no rales.  GI: Soft. Bowel sounds are normal. There is no tenderness.  Musculoskeletal:       Right ankle: She exhibits no swelling.       Left ankle: She exhibits no swelling.   Lymphadenopathy:    She has no cervical adenopathy.  Neurological: She is alert. No cranial nerve deficit.  Skin: Skin is warm. Nails show no clubbing.  Heel pads on for protection. Right heel has a little erythema.  Psychiatric: She has a normal mood and affect.    Data Reviewed: Basic Metabolic Panel:  Recent Labs Lab 01/02/15 0019 01/05/15 0414 01/06/15 0505  NA 139 137  --   K 4.3 4.7  --   CL 99* 104  --   CO2 29 28  --   GLUCOSE 155* 151*  --   BUN 20 44*  --   CREATININE 0.99 1.39* 1.50*  CALCIUM 9.1 9.2  --    Liver Function Tests:  Recent Labs Lab 01/02/15 0019  AST 24  ALT 8*  ALKPHOS 87  BILITOT 0.6  PROT 7.4  ALBUMIN 3.4*   CBC:  Recent Labs Lab 01/02/15 0019 01/05/15 0414  WBC 13.9* 9.9  NEUTROABS 11.9*  --   HGB 10.9* 9.7*  HCT 34.2* 30.1*  MCV 86.2 87.2  PLT 263 235    Scheduled Meds: . albuterol  2.5 mg Nebulization QID  . antiseptic oral rinse  7 mL Mouth Rinse q12n4p  . aspirin EC  81 mg Oral Daily  . budesonide (PULMICORT) nebulizer solution  0.25 mg Nebulization BID  . diltiazem  120 mg Oral Daily  . fluticasone  2 spray Each Nare Daily  . heparin subcutaneous  5,000 Units Subcutaneous 3 times per day  .  levofloxacin  750 mg Oral Q48H  . loratadine  10 mg Oral Daily  . methylPREDNISolone (SOLU-MEDROL) injection  40 mg Intravenous 3 times per day  . multivitamin with minerals  1 tablet Oral Daily  . pantoprazole  40 mg Oral Daily  . polyethylene glycol  17 g Oral Daily  . sodium chloride  3 mL Intravenous Q12H  . tiotropium  18 mcg Inhalation Daily    Assessment/Plan:  1. Acute on chronic respiratory failure with hypoxia- patient on nasal cannula 3 L.  2. COPD exacerbation- increase Solu-Medrol 40 mg every 8 hours and continue nebulizers and inhalers. Patient on Roxanol when necessary. Budesonide nebulizer. 3. Clinical sepsis- UC growing E coli sensitive to Levaquin. Acute cystitis with hematuria. 4. History of peripheral  vascular disease. 5. Acute encephalopathy- discontinue Seroquel 6. Failure to thrive, bedbound, poor mobility and poor appetite. Family interested in hospice back at their facility. Potential discharge over the weekend back to facility with hospice. 7. Urinary retention- as needed straight Catheter 8. Tachycardia- start low dose Cardizem CD.  Code Status:     Code Status Orders        Start     Ordered   Jan 03, 2015 0527  Do not attempt resuscitation (DNR)   Continuous    Question Answer Comment  In the event of cardiac or respiratory ARREST Do not call a "code blue"   In the event of cardiac or respiratory ARREST Do not perform Intubation, CPR, defibrillation or ACLS   In the event of cardiac or respiratory ARREST Use medication by any route, position, wound care, and other measures to relive pain and suffering. May use oxygen, suction and manual treatment of airway obstruction as needed for comfort.   Comments RN may pronounce death      01/03/2015 4098    Advance Directive Documentation        Most Recent Value   Type of Advance Directive  Out of facility DNR (pink MOST or yellow form)   Pre-existing out of facility DNR order (yellow form or pink MOST form)  Yellow form placed in chart (order not valid for inpatient use)   "MOST" Form in Place?       Family Communication: Family at bedside.  Disposition Plan: Back to facility with hospice when breathing better.  Antibiotics:  Levaquin  Time spent: 25 minutes, also spoke with hospice liaison and filled out discharge instructions if the patient goes over the weekend.  Alford Highland  [facility]

## 2015-01-06 NOTE — Discharge Summary (Signed)
[facility] [place] [facility]   PATIENT NAME: Rita Cox    MR#:  [id number]  DATE OF BIRTH:  [birth year]  DATE OF ADMISSION:  01/02/2015 ADMITTING PHYSICIAN: Crissie Figures, MD  DATE OF DISCHARGE: Potential discharge 01/07/2015  PRIMARY CARE PHYSICIAN: Tillman Abide, MD    ADMISSION DIAGNOSIS:  Dyspnea [R06.00] Hypoxia [R09.02] Acute cystitis with hematuria [N30.01] Systemic inflammatory response syndrome (SIRS) [A41.9]  DISCHARGE DIAGNOSIS:  Principal Problem:   Sepsis due to urinary tract infection Active Problems:   COPD (chronic obstructive pulmonary disease)   Hypertension   Anemia   Acute on chronic respiratory failure   Pressure ulcer   SECONDARY DIAGNOSIS:   Past Medical History  Diagnosis Date  . COPD (chronic obstructive pulmonary disease)   . Hypertension   . Hyperlipidemia   . Impaired fasting glucose   . AAA (abdominal aortic aneurysm)   . Chronic renal insufficiency   . Anemia   . Sleep disturbance   . Dysthymia   . Macular degeneration     HOSPITAL COURSE:   1. Acute on chronic respiratory failure with hypoxia. Initially the patient needed a BiPAP in order to oxygenate and then switched over to high flow nasal cannula. Now off of the high flow nasal cannula on regular nasal cannula. 2. COPD exacerbation. The patient was put on high-dose Solu-Medrol. She was tapered down to medium dose Solu-Medrol and worsened and needed to go back on high-dose Solu-Medrol. Hopefully we can taper over to to medium dose steroids today and potential over to oral steroids tomorrow. For discharge. Levaquin will cover COPD exacerbation. 3. Clinical sepsis. Acute cystitis with hematuria. Urine cultures growing Escherichia coli which is sensitive to Levaquin and continue the course. 4. Acute renal failure on chronic kidney disease creatinine worsen while here I had a start IV fluid hydration. Check BMP on the day of discharge. 5. Essential  hypertension and tachycardia- Cardizem CD started. 6. Insomnia- I did get the patient to sleep with Seroquel but it made her have cloudiness the next morning so I stopped it. 7. Failure to thrive, bedbound, poor mobility and poor appetite. The patient will be set up with hospice at their facility. Patient is a DO NOT RESUSCITATE. Roxanol as needed for pain or shortness of breath. Overall prognosis is poor. Try to avoid hospitalizations.  DISCHARGE CONDITIONS:   Guarded  CONSULTS OBTAINED:  Hospice  DRUG ALLERGIES:  No Known Allergies  DISCHARGE MEDICATIONS:   Current Discharge Medication List    START taking these medications   Details  budesonide (PULMICORT) 0.25 MG/2ML nebulizer solution Take 2 mLs (0.25 mg total) by nebulization 2 (two) times daily. Qty: 60 mL, Refills: 12    diltiazem (CARDIZEM CD) 120 MG 24 hr capsule Take 1 capsule (120 mg total) by mouth daily. Qty: 30 capsule, Refills: 0    levofloxacin (LEVAQUIN) 750 MG tablet Take 1 tablet (750 mg total) by mouth every other day. Qty: 3 tablet, Refills: 0    Morphine Sulfate (MORPHINE CONCENTRATE) 10 MG/0.5ML SOLN concentrated solution Take 0.25-0.5 mLs (5-10 mg total) by mouth every 2 (two) hours as needed for shortness of breath. Qty: 42 mL, Refills: 0    predniSONE (DELTASONE) 20 MG tablet Take 1 tablet (20 mg total) by mouth daily with breakfast. Take two tabs daily for four days Qty: 8 tablet, Refills: 0      CONTINUE these medications which have NOT CHANGED   Details  acetaminophen (TYLENOL) 500 MG tablet Take  500 mg by mouth every 6 (six) hours as needed.    albuterol (PROVENTIL HFA;VENTOLIN HFA) 108 (90 BASE) MCG/ACT inhaler Inhale 2 puffs into the lungs every 6 (six) hours as needed for wheezing or shortness of breath.    fluticasone (FLONASE) 50 MCG/ACT nasal spray Place 2 sprays into both nostrils daily.    HYDROcodone-acetaminophen (NORCO/VICODIN) 5-325 MG per tablet Take 1 tablet by mouth 3 (three)  times daily as needed for moderate pain.    Infant Care Products (DERMACLOUD) CREA Apply 1 application topically 2 (two) times daily as needed. Qty: 430 g, Refills: 5    ipratropium-albuterol (DUONEB) 0.5-2.5 (3) MG/3ML SOLN Take 3 mLs by nebulization every 4 (four) hours as needed.    loratadine (CLARITIN) 10 MG tablet Take 10 mg by mouth daily.    magnesium hydroxide (MILK OF MAGNESIA) 400 MG/5ML suspension Take 5 mLs by mouth daily as needed for mild constipation.    mirtazapine (REMERON) 15 MG tablet Take 7.5 mg by mouth at bedtime.     Multiple Vitamins-Minerals (MULTIVITAMIN WITH MINERALS) tablet Take 1 tablet by mouth daily.    nystatin (MYCOSTATIN) powder Apply topically 2 (two) times daily as needed. For rash under breasts Qty: 60 g, Refills: 11    omeprazole (PRILOSEC) 20 MG capsule Take 20 mg by mouth daily as needed.     polyethylene glycol (MIRALAX / GLYCOLAX) packet Take 17 g by mouth daily as needed.     polyethylene glycol powder (GLYCOLAX/MIRALAX) powder Take 1 Container by mouth once.    sennosides-docusate sodium (SENOKOT-S) 8.6-50 MG tablet Take 1 tablet by mouth 2 (two) times daily.     tiotropium (SPIRIVA) 18 MCG inhalation capsule Place 1 capsule (18 mcg total) into inhaler and inhale daily. Qty: 30 capsule, Refills: 5    traZODone (DESYREL) 100 MG tablet TAKE ONE TABLET BY MOUTH AT BEDTIME AS NEEDED Qty: 30 tablet, Refills: 11      STOP taking these medications     ADVAIR DISKUS 250-50 MCG/DOSE AEPB      amLODipine (NORVASC) 5 MG tablet      metoprolol succinate (TOPROL-XL) 25 MG 24 hr tablet          DISCHARGE INSTRUCTIONS:   Follow-up with Dr. at rehabilitation 1 day  If you experience worsening of your admission symptoms, develop shortness of breath, life threatening emergency, suicidal or homicidal thoughts you must seek medical attention immediately by calling 911 or calling your MD immediately  if symptoms less severe.  You Must read  complete instructions/literature along with all the possible adverse reactions/side effects for all the Medicines you take and that have been prescribed to you. Take any new Medicines after you have completely understood and accept all the possible adverse reactions/side effects.   Please note  You were cared for by a hospitalist during your hospital stay. If you have any questions about your discharge medications or the care you received while you were in the hospital after you are discharged, you can call the unit and asked to speak with the hospitalist on call if the hospitalist that took care of you is not available. Once you are discharged, your primary care physician will handle any further medical issues. Please note that NO REFILLS for any discharge medications will be authorized once you are discharged, as it is imperative that you return to your primary care physician (or establish a relationship with a primary care physician if you do not have one) for your aftercare needs so  that they can reassess your need for medications and monitor your lab values.    Today   CHIEF COMPLAINT:   Chief Complaint  Patient presents with  . Shortness of Breath    HISTORY OF PRESENT ILLNESS:  Rita Cox  is a 81 y.o. female with a known history of COPD presented with acute respiratory failure requiring BiPAP in order to oxygenate   VITAL SIGNS:  Blood pressure 108/50, pulse 93, temperature 97.6 F (36.4 C), temperature source Oral, resp. rate 20, height 5\' 3"  (1.6 m), weight 51.438 kg (113 lb 6.4 oz), SpO2 90 %.   PHYSICAL EXAMINATION:  GENERAL:  81 y.o.-year-old patient lying in the bed with no acute distress.  EYES: Pupils equal, round, reactive to light and accommodation. No scleral icterus. Extraocular muscles intact.  HEENT: Head atraumatic, normocephalic. Oropharynx and nasopharynx clear.  NECK:  Supple, no jugular venous distention. No thyroid enlargement, no tenderness.  LUNGS:  Decreased breath sounds bilaterally, lower airway wheezing, no rales,rhonchi or crepitation. No use of accessory muscles of respiration.  CARDIOVASCULAR: S1, S2 normal. No murmurs, rubs, or gallops.  ABDOMEN: Soft, non-tender, non-distended. Bowel sounds present. No organomegaly or mass.  EXTREMITIES: No pedal edema, cyanosis, or clubbing.  NEUROLOGIC: Cranial nerves II through XII are intact. Muscle strength 5/5 in all extremities. Sensation intact. Gait not checked.  PSYCHIATRIC: The patient is alert and oriented x 3.  SKIN: No obvious rash, lesion, or ulcer.   DATA REVIEW:   CBC  Recent Labs Lab 01/05/15 0414  WBC 9.9  HGB 9.7*  HCT 30.1*  PLT 235    Chemistries   Recent Labs Lab 01/02/15 0019 01/05/15 0414 01/06/15 0505  NA 139 137  --   K 4.3 4.7  --   CL 99* 104  --   CO2 29 28  --   GLUCOSE 155* 151*  --   BUN 20 44*  --   CREATININE 0.99 1.39* 1.50*  CALCIUM 9.1 9.2  --   AST 24  --   --   ALT 8*  --   --   ALKPHOS 87  --   --   BILITOT 0.6  --   --     Cardiac Enzymes  Recent Labs Lab 01/02/15 0019  TROPONINI 0.03    Microbiology Results  Results for orders placed or performed during the hospital encounter of 01/02/15  Blood culture (routine x 2)     Status: None (Preliminary result)   Collection Time: 01/02/15 12:19 AM  Result Value Ref Range Status   Specimen Description BLOOD RIGHT ASSIST CONTROL  Final   Special Requests BOTTLES DRAWN AEROBIC AND ANAEROBIC 7CC  Final   Culture NO GROWTH 4 DAYS  Final   Report Status PENDING  Incomplete  Urine culture     Status: None   Collection Time: 01/02/15 12:43 AM  Result Value Ref Range Status   Specimen Description URINE, CLEAN CATCH  Final   Special Requests Normal  Final   Culture >=100,000 COLONIES/mL ESCHERICHIA COLI  Final   Report Status 01/04/2015 FINAL  Final   Organism ID, Bacteria ESCHERICHIA COLI  Final      Susceptibility   Escherichia coli - MIC*    AMPICILLIN >=32 RESISTANT  Resistant     CEFAZOLIN <=4 SENSITIVE Sensitive     CEFTRIAXONE <=1 SENSITIVE Sensitive     CIPROFLOXACIN <=0.25 SENSITIVE Sensitive     GENTAMICIN <=1 SENSITIVE Sensitive     IMIPENEM <=0.25 SENSITIVE Sensitive  NITROFURANTOIN <=16 SENSITIVE Sensitive     TRIMETH/SULFA >=320 RESISTANT Resistant     PIP/TAZO Value in next row Sensitive      SENSITIVE<=4    * >=100,000 COLONIES/mL ESCHERICHIA COLI  Blood culture (routine x 2)     Status: None (Preliminary result)   Collection Time: 2015/01/29  1:00 AM  Result Value Ref Range Status   Specimen Description BLOOD RIGHT ASSIST CONTROL  Final   Special Requests BOTTLES DRAWN AEROBIC AND ANAEROBIC 7CC  Final   Culture NO GROWTH 4 DAYS  Final   Report Status PENDING  Incomplete    Management plans discussed with the patient and family, and they are in agreement.  CODE STATUS:     Code Status Orders        Start     Ordered   01-29-2015 0527  Do not attempt resuscitation (DNR)   Continuous    Question Answer Comment  In the event of cardiac or respiratory ARREST Do not call a "code blue"   In the event of cardiac or respiratory ARREST Do not perform Intubation, CPR, defibrillation or ACLS   In the event of cardiac or respiratory ARREST Use medication by any route, position, wound care, and other measures to relive pain and suffering. May use oxygen, suction and manual treatment of airway obstruction as needed for comfort.   Comments RN may pronounce death      01-29-15 1610    Advance Directive Documentation        Most Recent Value   Type of Advance Directive  Out of facility DNR (pink MOST or yellow form)   Pre-existing out of facility DNR order (yellow form or pink MOST form)  Yellow form placed in chart (order not valid for inpatient use)   "MOST" Form in Place?        TOTAL TIME TAKING CARE OF THIS PATIENT: 32 minutes.    Alford Highland M.D on 01/06/2015 at 1:51 PM  Between 7am to 6pm - Pager - [phone]  After  6pm go to [email or web address] - password [email or web address] [facility]  [place]  Office  [phone]  CC: Primary care physician; Tillman Abide, MD

## 2015-01-07 LAB — CULTURE, BLOOD (ROUTINE X 2)
CULTURE: NO GROWTH
Culture: NO GROWTH

## 2015-01-07 LAB — BASIC METABOLIC PANEL
ANION GAP: 7 (ref 5–15)
BUN: 60 mg/dL — AB (ref 6–20)
CALCIUM: 9.1 mg/dL (ref 8.9–10.3)
CHLORIDE: 104 mmol/L (ref 101–111)
CO2: 27 mmol/L (ref 22–32)
Creatinine, Ser: 1.52 mg/dL — ABNORMAL HIGH (ref 0.44–1.00)
GFR calc non Af Amer: 29 mL/min — ABNORMAL LOW (ref >60)
GFR, EST AFRICAN AMERICAN: 34 mL/min — AB (ref >60)
Glucose, Bld: 194 mg/dL — ABNORMAL HIGH (ref 65–99)
Potassium: 5.1 mmol/L (ref 3.5–5.1)
Sodium: 138 mmol/L (ref 135–145)

## 2015-01-07 MED ORDER — MEGESTROL ACETATE 40 MG/ML PO SUSP: 400.0000 mg | Freq: Every day | ORAL | Status: AC

## 2015-01-07 NOTE — Clinical Social Work Note (Signed)
CSW notified RN that DC packet was ready for pt's DC back to [facility].  CSW signing off unless further needs arise.

## 2015-01-07 NOTE — Progress Notes (Signed)
Patient ID: Rita Cox, female   DOB: Jan 17, 1925, 81 y.o.   MRN: [id number] [facility] PROGRESS NOTE  PCP: Tillman Abide, MD  HPI/Subjective: pateint doing same this morning accroding to daughter patient breathing imprvoed, no new changes   Objective: Filed Vitals:   01/07/15 0514  BP: 119/66  Pulse: 114  Temp: 97.5 F (36.4 C)  Resp:     ROS: Review of Systems  Constitutional: Negative for fever and chills.  Eyes: Negative for blurred vision.  Respiratory: Positive for cough and shortness of breath. Negative for sputum production.   Cardiovascular: Negative for chest pain.  Gastrointestinal: Positive for constipation. Negative for nausea, vomiting, abdominal pain and diarrhea.  Genitourinary: Negative for dysuria.  Musculoskeletal: Negative for joint pain.  Neurological: Negative for dizziness and headaches.   Exam: Physical Exam  HENT:  Nose: No mucosal edema.  Mouth/Throat: No oropharyngeal exudate or posterior oropharyngeal edema.  Eyes: Conjunctivae, EOM and lids are normal. Pupils are equal, round, and reactive to light.  Neck: No JVD present. Carotid bruit is not present. No edema present. No thyroid mass and no thyromegaly present.  Cardiovascular: S1 normal and S2 normal.  Exam reveals no gallop.   No murmur heard. Pulses:      Dorsalis pedis pulses are 1+ on the right side, and 1+ on the left side.  Respiratory: No accessory muscle usage. No respiratory distress.  Decreased bs GI: Soft. Bowel sounds are normal. There is no tenderness.  Musculoskeletal:       Right ankle: She exhibits no swelling.       Left ankle: She exhibits no swelling.  Lymphadenopathy:    She has no cervical adenopathy.  Neurological: She is alert. No cranial nerve deficit.  Skin: Skin is warm. Nails show no clubbing.  Heel pads on for protection. Right heel has a little erythema.  Psychiatric: She has a normal mood and affect.    Data Reviewed: Basic Metabolic  Panel:  Recent Labs Lab 01/02/15 0019 01/05/15 0414 01/06/15 0505 01/07/15 0536  NA 139 137  --  138  K 4.3 4.7  --  5.1  CL 99* 104  --  104  CO2 29 28  --  27  GLUCOSE 155* 151*  --  194*  BUN 20 44*  --  60*  CREATININE 0.99 1.39* 1.50* 1.52*  CALCIUM 9.1 9.2  --  9.1   Liver Function Tests:  Recent Labs Lab 01/02/15 0019  AST 24  ALT 8*  ALKPHOS 87  BILITOT 0.6  PROT 7.4  ALBUMIN 3.4*   CBC:  Recent Labs Lab 01/02/15 0019 01/05/15 0414  WBC 13.9* 9.9  NEUTROABS 11.9*  --   HGB 10.9* 9.7*  HCT 34.2* 30.1*  MCV 86.2 87.2  PLT 263 235    Scheduled Meds: . albuterol  2.5 mg Nebulization QID  . antiseptic oral rinse  7 mL Mouth Rinse q12n4p  . aspirin EC  81 mg Oral Daily  . budesonide (PULMICORT) nebulizer solution  0.25 mg Nebulization BID  . diltiazem  120 mg Oral Daily  . fluticasone  2 spray Each Nare Daily  . heparin subcutaneous  5,000 Units Subcutaneous 3 times per day  . levofloxacin  750 mg Oral Q48H  . loratadine  10 mg Oral Daily  . methylPREDNISolone (SOLU-MEDROL) injection  40 mg Intravenous 3 times per day  . multivitamin with minerals  1 tablet Oral Daily  . pantoprazole  40 mg Oral Daily  . polyethylene glycol  17 g Oral Daily  . sodium chloride  3 mL Intravenous Q12H  . tiotropium  18 mcg Inhalation Daily    Assessment/Plan:  1. Acute on chronic respiratory failure with hypoxia- patient on nasal cannula 3 L.  2. COPD exacerbation- predinosone taper, continue inhalers 3. Clinical sepsis- UC growing E coli sensitive to Levaquin. Acute cystitis with hematuria. 4. History of peripheral vascular disease. 5. Acute encephalopathy- discontinue Seroquel 6. Failure to thrive, bedbound, poor mobility and poor appetite. Family interested in hospice back at their facility.  Discharger back add megace 7. Urinary retention- as needed straight Catheter 8. Tachycardia- start low dose Cardizem CD.  Code Status:     Code Status Orders         Start     Ordered   01-29-15 0527  Do not attempt resuscitation (DNR)   Continuous    Question Answer Comment  In the event of cardiac or respiratory ARREST Do not call a "code blue"   In the event of cardiac or respiratory ARREST Do not perform Intubation, CPR, defibrillation or ACLS   In the event of cardiac or respiratory ARREST Use medication by any route, position, wound care, and other measures to relive pain and suffering. May use oxygen, suction and manual treatment of airway obstruction as needed for comfort.   Comments RN may pronounce death      01/29/2015 1610    Advance Directive Documentation        Most Recent Value   Type of Advance Directive  Out of facility DNR (pink MOST or yellow form)   Pre-existing out of facility DNR order (yellow form or pink MOST form)  Yellow form placed in chart (order not valid for inpatient use)   "MOST" Form in Place?       Family Communication: Family at bedside.  Disposition Plan: Back to facility with hospice when breathing better.  Antibiotics:  Levaquin  Time spent: 35 minutes,  discharge  Amaura Authier, [facility]  [facility]

## 2015-01-07 NOTE — Discharge Summary (Signed)
[facility] [place] [facility]   PATIENT NAME: Rita Cox    MR#:  [id number]  DATE OF BIRTH:  09-05-1924  DATE OF ADMISSION:  01/02/2015 ADMITTING PHYSICIAN: Crissie Figures, MD  DATE OF DISCHARGE: Potential discharge 01/07/2015  PRIMARY CARE PHYSICIAN: Tillman Abide, MD    ADMISSION DIAGNOSIS:  Dyspnea [R06.00] Hypoxia [R09.02] Acute cystitis with hematuria [N30.01] Systemic inflammatory response syndrome (SIRS) [A41.9]  DISCHARGE DIAGNOSIS:  Principal Problem:   Sepsis due to urinary tract infection Active Problems:   COPD (chronic obstructive pulmonary disease)   Hypertension   Anemia   Acute on chronic respiratory failure   Pressure ulcer   SECONDARY DIAGNOSIS:   Past Medical History  Diagnosis Date  . COPD (chronic obstructive pulmonary disease)   . Hypertension   . Hyperlipidemia   . Impaired fasting glucose   . AAA (abdominal aortic aneurysm)   . Chronic renal insufficiency   . Anemia   . Sleep disturbance   . Dysthymia   . Macular degeneration     HOSPITAL COURSE:   1. Acute on chronic respiratory failure with hypoxia. Initially the patient needed a BiPAP in order to oxygenate and then switched over to high flow nasal cannula. Now off of the high flow nasal cannula on regular nasal cannula.Currently on 3l oxygen.  2. COPD exacerbation. The patient was put on high-dose Solu-Medrol. She was tapered down to medium dose Solu-Medrol and worsened and needed to go back on high-dose Solu-Medrol.  Patient on oral steroids taper 3. Clinical sepsis. Acute cystitis with hematuria. Urine cultures growing Escherichia coli which is sensitive to Levaquin and continue the course. 4. Acute renal failure on chronic kidney disease creatinine worsen while here I had a start IV fluid hydration.  Renal fxn stable on d/c 5. Essential hypertension and tachycardia- Cardizem CD started. 6. Insomnia- failed treatement with seroquel, other medication  can be tried at snf 7. Failure to thrive, bedbound, poor mobility and poor appetite. The patient will be set up with hospice at their facility. Patient is a DO NOT RESUSCITATE. Roxanol as needed for pain or shortness of breath. Overall prognosis is poor. Try to avoid hospitalizations. Also started patient on megace for apetite  DISCHARGE CONDITIONS:   Guarded  CONSULTS OBTAINED:  Hospice  DRUG ALLERGIES:  No Known Allergies  DISCHARGE MEDICATIONS:   Current Discharge Medication List    START taking these medications   Details  budesonide (PULMICORT) 0.25 MG/2ML nebulizer solution Take 2 mLs (0.25 mg total) by nebulization 2 (two) times daily. Qty: 60 mL, Refills: 12    diltiazem (CARDIZEM CD) 120 MG 24 hr capsule Take 1 capsule (120 mg total) by mouth daily. Qty: 30 capsule, Refills: 0    levofloxacin (LEVAQUIN) 750 MG tablet Take 1 tablet (750 mg total) by mouth every other day. Qty: 3 tablet, Refills: 0    megestrol (MEGACE ORAL) 40 MG/ML suspension Take 10 mLs (400 mg total) by mouth daily. Qty: 240 mL, Refills: 0    Morphine Sulfate (MORPHINE CONCENTRATE) 10 MG/0.5ML SOLN concentrated solution Take 0.25-0.5 mLs (5-10 mg total) by mouth every 2 (two) hours as needed for shortness of breath. Qty: 42 mL, Refills: 0    predniSONE (DELTASONE) 20 MG tablet Take 1 tablet (20 mg total) by mouth daily with breakfast. Take two tabs daily for four days Qty: 8 tablet, Refills: 0      CONTINUE these medications which have NOT CHANGED   Details  acetaminophen (TYLENOL) 500  MG tablet Take 500 mg by mouth every 6 (six) hours as needed.    albuterol (PROVENTIL HFA;VENTOLIN HFA) 108 (90 BASE) MCG/ACT inhaler Inhale 2 puffs into the lungs every 6 (six) hours as needed for wheezing or shortness of breath.    fluticasone (FLONASE) 50 MCG/ACT nasal spray Place 2 sprays into both nostrils daily.    HYDROcodone-acetaminophen (NORCO/VICODIN) 5-325 MG per tablet Take 1 tablet by mouth 3  (three) times daily as needed for moderate pain.    Infant Care Products (DERMACLOUD) CREA Apply 1 application topically 2 (two) times daily as needed. Qty: 430 g, Refills: 5    ipratropium-albuterol (DUONEB) 0.5-2.5 (3) MG/3ML SOLN Take 3 mLs by nebulization every 4 (four) hours as needed.    loratadine (CLARITIN) 10 MG tablet Take 10 mg by mouth daily.    magnesium hydroxide (MILK OF MAGNESIA) 400 MG/5ML suspension Take 5 mLs by mouth daily as needed for mild constipation.    mirtazapine (REMERON) 15 MG tablet Take 7.5 mg by mouth at bedtime.     Multiple Vitamins-Minerals (MULTIVITAMIN WITH MINERALS) tablet Take 1 tablet by mouth daily.    nystatin (MYCOSTATIN) powder Apply topically 2 (two) times daily as needed. For rash under breasts Qty: 60 g, Refills: 11    omeprazole (PRILOSEC) 20 MG capsule Take 20 mg by mouth daily as needed.     polyethylene glycol (MIRALAX / GLYCOLAX) packet Take 17 g by mouth daily as needed.     polyethylene glycol powder (GLYCOLAX/MIRALAX) powder Take 1 Container by mouth once.    sennosides-docusate sodium (SENOKOT-S) 8.6-50 MG tablet Take 1 tablet by mouth 2 (two) times daily.     tiotropium (SPIRIVA) 18 MCG inhalation capsule Place 1 capsule (18 mcg total) into inhaler and inhale daily. Qty: 30 capsule, Refills: 5    traZODone (DESYREL) 100 MG tablet TAKE ONE TABLET BY MOUTH AT BEDTIME AS NEEDED Qty: 30 tablet, Refills: 11      STOP taking these medications     ADVAIR DISKUS 250-50 MCG/DOSE AEPB      amLODipine (NORVASC) 5 MG tablet      metoprolol succinate (TOPROL-XL) 25 MG 24 hr tablet          DISCHARGE INSTRUCTIONS:   Follow-up with Dr. at rehabilitation 1 day  If you experience worsening of your admission symptoms, develop shortness of breath, life threatening emergency, suicidal or homicidal thoughts you must seek medical attention immediately by calling 911 or calling your MD immediately  if symptoms less severe.  You Must  read complete instructions/literature along with all the possible adverse reactions/side effects for all the Medicines you take and that have been prescribed to you. Take any new Medicines after you have completely understood and accept all the possible adverse reactions/side effects.   Please note  You were cared for by a hospitalist during your hospital stay. If you have any questions about your discharge medications or the care you received while you were in the hospital after you are discharged, you can call the unit and asked to speak with the hospitalist on call if the hospitalist that took care of you is not available. Once you are discharged, your primary care physician will handle any further medical issues. Please note that NO REFILLS for any discharge medications will be authorized once you are discharged, as it is imperative that you return to your primary care physician (or establish a relationship with a primary care physician if you do not have one) for your  aftercare needs so that they can reassess your need for medications and monitor your lab values.    Today   CHIEF COMPLAINT:   Chief Complaint  Patient presents with  . Shortness of Breath    HISTORY OF PRESENT ILLNESS:  Rita Cox  is a 81 y.o. female with a known history of COPD presented with acute respiratory failure requiring BiPAP in order to oxygenate   VITAL SIGNS:  Blood pressure 119/66, pulse 114, temperature 97.5 F (36.4 C), temperature source Oral, resp. rate 20, height 5\' 3"  (1.6 m), weight 51.438 kg (113 lb 6.4 oz), SpO2 99 %.   PHYSICAL EXAMINATION:  GENERAL:  81 y.o.-year-old patient lying in the bed with no acute distress.  EYES: Pupils equal, round, reactive to light and accommodation. No scleral icterus. Extraocular muscles intact.  HEENT: Head atraumatic, normocephalic. Oropharynx and nasopharynx clear.  NECK:  Supple, no jugular venous distention. No thyroid enlargement, no tenderness.  LUNGS:  Decreased breath sounds bilaterally, lower airway wheezing, no rales,rhonchi or crepitation. No use of accessory muscles of respiration.  CARDIOVASCULAR: S1, S2 normal. No murmurs, rubs, or gallops.  ABDOMEN: Soft, non-tender, non-distended. Bowel sounds present. No organomegaly or mass.  EXTREMITIES: No pedal edema, cyanosis, or clubbing.  NEUROLOGIC: Cranial nerves II through XII are intact. Muscle strength 5/5 in all extremities. Sensation intact. Gait not checked.  PSYCHIATRIC: The patient is alert and oriented x 3.  SKIN: No obvious rash, lesion, or ulcer.   DATA REVIEW:   CBC  Recent Labs Lab 01/05/15 0414  WBC 9.9  HGB 9.7*  HCT 30.1*  PLT 235    Chemistries   Recent Labs Lab 01/02/15 0019  01/07/15 0536  NA 139  < > 138  K 4.3  < > 5.1  CL 99*  < > 104  CO2 29  < > 27  GLUCOSE 155*  < > 194*  BUN 20  < > 60*  CREATININE 0.99  < > 1.52*  CALCIUM 9.1  < > 9.1  AST 24  --   --   ALT 8*  --   --   ALKPHOS 87  --   --   BILITOT 0.6  --   --   < > = values in this interval not displayed.  Cardiac Enzymes  Recent Labs Lab 01/02/15 0019  TROPONINI 0.03    Microbiology Results  Results for orders placed or performed during the hospital encounter of 01/02/15  Blood culture (routine x 2)     Status: None   Collection Time: 01/02/15 12:19 AM  Result Value Ref Range Status   Specimen Description BLOOD RIGHT ASSIST CONTROL  Final   Special Requests BOTTLES DRAWN AEROBIC AND ANAEROBIC 7CC  Final   Culture NO GROWTH 5 DAYS  Final   Report Status 01/07/2015 FINAL  Final  Urine culture     Status: None   Collection Time: 01/02/15 12:43 AM  Result Value Ref Range Status   Specimen Description URINE, CLEAN CATCH  Final   Special Requests Normal  Final   Culture >=100,000 COLONIES/mL ESCHERICHIA COLI  Final   Report Status 01/04/2015 FINAL  Final   Organism ID, Bacteria ESCHERICHIA COLI  Final      Susceptibility   Escherichia coli - MIC*    AMPICILLIN >=32  RESISTANT Resistant     CEFAZOLIN <=4 SENSITIVE Sensitive     CEFTRIAXONE <=1 SENSITIVE Sensitive     CIPROFLOXACIN <=0.25 SENSITIVE Sensitive     GENTAMICIN <=1  SENSITIVE Sensitive     IMIPENEM <=0.25 SENSITIVE Sensitive     NITROFURANTOIN <=16 SENSITIVE Sensitive     TRIMETH/SULFA >=320 RESISTANT Resistant     PIP/TAZO Value in next row Sensitive      SENSITIVE<=4    * >=100,000 COLONIES/mL ESCHERICHIA COLI  Blood culture (routine x 2)     Status: None   Collection Time: 02/01/2015  1:00 AM  Result Value Ref Range Status   Specimen Description BLOOD RIGHT ASSIST CONTROL  Final   Special Requests BOTTLES DRAWN AEROBIC AND ANAEROBIC 7CC  Final   Culture NO GROWTH 5 DAYS  Final   Report Status 01/07/2015 FINAL  Final    Management plans discussed with the patient and family, and they are in agreement.  CODE STATUS:     Code Status Orders        Start     Ordered   01-Feb-2015 0527  Do not attempt resuscitation (DNR)   Continuous    Question Answer Comment  In the event of cardiac or respiratory ARREST Do not call a "code blue"   In the event of cardiac or respiratory ARREST Do not perform Intubation, CPR, defibrillation or ACLS   In the event of cardiac or respiratory ARREST Use medication by any route, position, wound care, and other measures to relive pain and suffering. May use oxygen, suction and manual treatment of airway obstruction as needed for comfort.   Comments RN may pronounce death      2015/02/01 0981    Advance Directive Documentation        Most Recent Value   Type of Advance Directive  Out of facility DNR (pink MOST or yellow form)   Pre-existing out of facility DNR order (yellow form or pink MOST form)  Yellow form placed in chart (order not valid for inpatient use)   "MOST" Form in Place?        TOTAL TIME TAKING CARE OF THIS PATIENT: 32 minutes.    Auburn Bilberry M.D on 01/07/2015 at 8:55 AM  Between 7am to 6pm - Pager - [phone]  After 6pm go to  [email or web address] - password [email or web address] [facility]  [facility]  Office  [phone]  CC: Primary care physician; Tillman Abide, MD

## 2015-01-07 NOTE — Progress Notes (Signed)
Patient discharged via EMS to [facility]. Family will be following ambulance during transport. Bo Mcclintock, RN

## 2015-01-07 NOTE — Plan of Care (Signed)
Problem: Discharge Progression Outcomes Goal: Other Discharge Outcomes/Goals Outcome: Progressing No complaints of pain this shift. Patient remains on 3L-O2. Repositioned and turned every 2 hours. Family at bedside.

## 2015-01-07 NOTE — Progress Notes (Signed)
Report called to [facility] - made aware of patient returning with hospice services. EMS called for transportation. Bo Mcclintock, RN

## 2015-01-09 ENCOUNTER — Telehealth: Payer: Self-pay | Admitting: *Deleted

## 2015-01-09 NOTE — Telephone Encounter (Signed)
Called son Fayrene Fearing and expressed my condolences

## 2015-01-09 NOTE — Telephone Encounter (Signed)
She was not my patient any longer since in [facility] but sorry to hear of her passing

## 2015-01-09 NOTE — Telephone Encounter (Signed)
Transitional care call attempted.  Spoke to patient's daughter.  Rita Cox was discharged home to [facility] 02-06-2023 where she passed away Feb 07, 2023 at Nov 07, 2028.  Chart updated.

## 2015-01-09 DEATH — deceased

## 2017-04-08 IMAGING — CR DG CHEST 1V PORT
1 series · 1 of 1 positions shown · non-contrast
Comparison: Sized [REDACTED]

CLINICAL DATA: Cough and dyspnea.

EXAM:
PORTABLE CHEST - 1 VIEW

[portable]
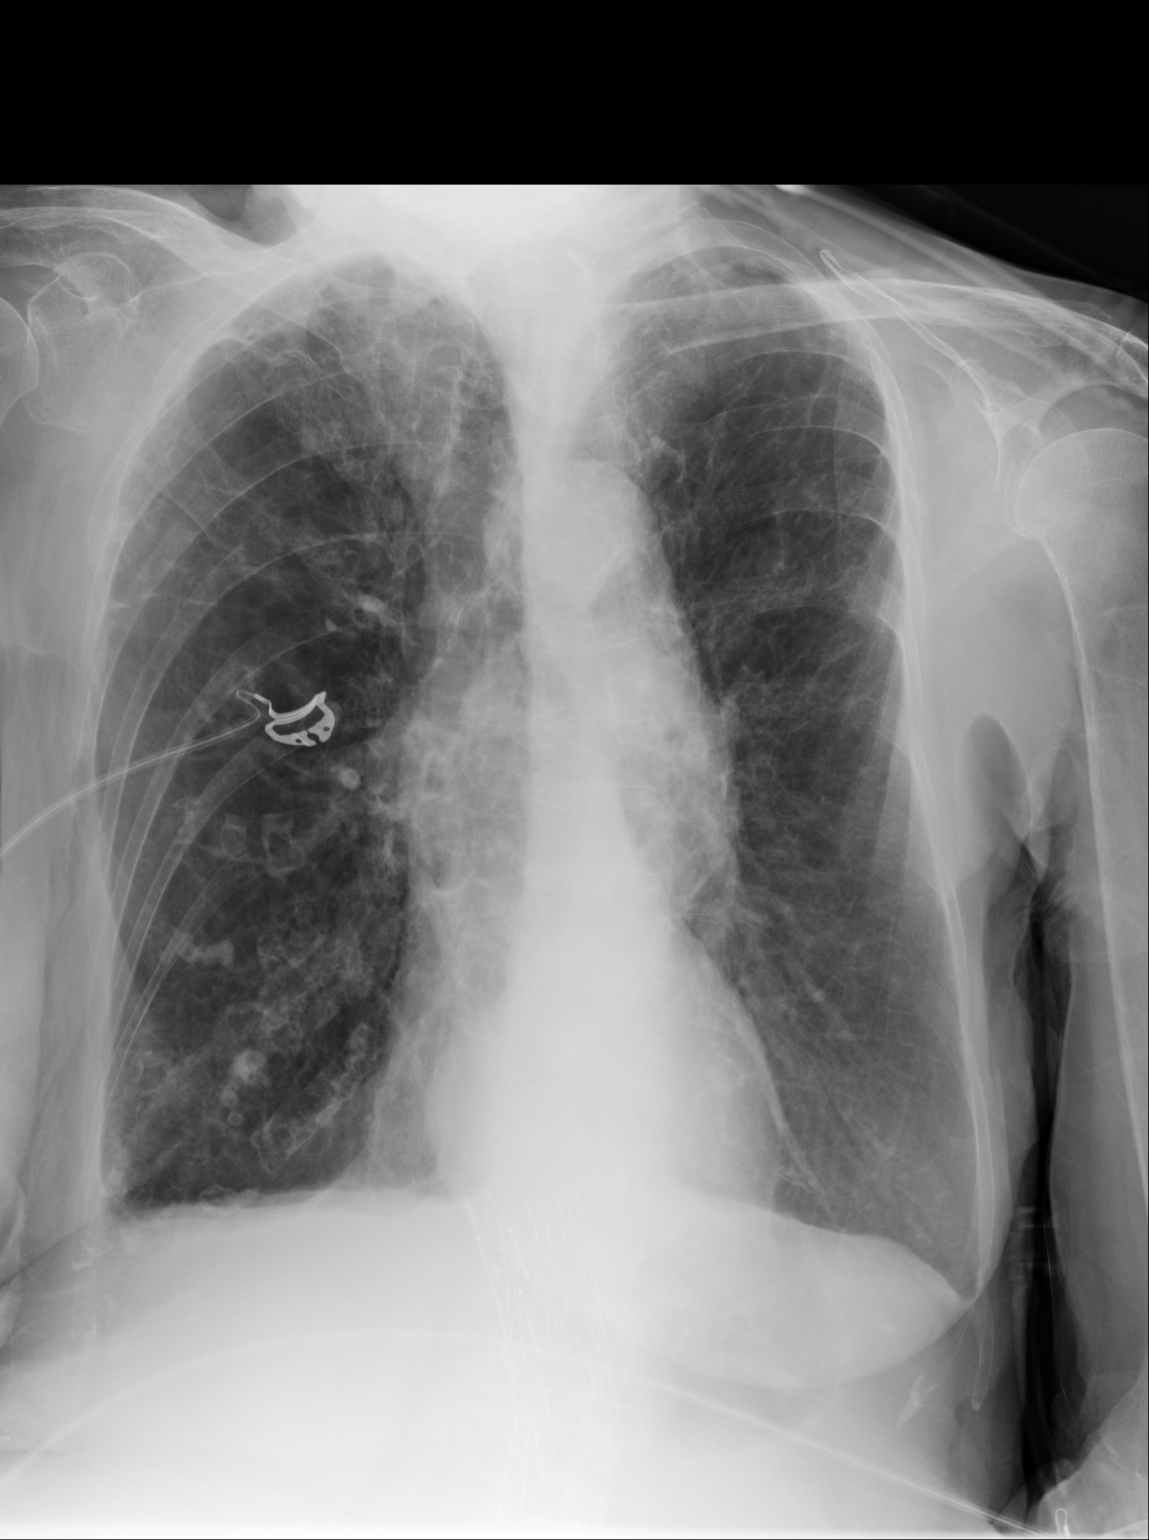

[1 of 1 positions shown; findings below may reference images not displayed]

FINDINGS: The lungs are hyperinflated with emphysema. Rounded calcifications
projecting over the right lower lung zone are likely related to
costochondral cartilage, and appears similar to prior exam. No
confluent airspace disease. The heart size is normal, there is
tortuosity of the thoracic aorta. Aortic stent graft is noted. No
pneumothorax or large pleural effusion.
IMPRESSION: Advanced emphysema. No evidence of superimposed acute process on
portable chest radiograph. Chronic calcifications projecting over
the right lower lung zone are likely related to costochondral
cartilage.

## 2019-11-16 ENCOUNTER — Telehealth: Payer: Self-pay

## 2019-11-16 NOTE — Telephone Encounter (Signed)
error
